# Patient Record
Sex: Female | Born: 1945 | Race: White | Hispanic: No | Smoking: Never smoker
Health system: Southern US, Community
[De-identification: ages and names within clinical notes are randomized; demographics above are authoritative.]

## PROBLEM LIST (undated history)

## (undated) DIAGNOSIS — C801 Malignant (primary) neoplasm, unspecified: Secondary | ICD-10-CM

## (undated) DIAGNOSIS — E119 Type 2 diabetes mellitus without complications: Secondary | ICD-10-CM

## (undated) DIAGNOSIS — J45909 Unspecified asthma, uncomplicated: Secondary | ICD-10-CM

## (undated) HISTORY — DX: Unspecified asthma, uncomplicated: J45.909

## (undated) HISTORY — PX: GASTRIC RESTRICTION SURGERY: SHX653

## (undated) HISTORY — PX: OTHER SURGICAL HISTORY: SHX169

## (undated) HISTORY — DX: Malignant (primary) neoplasm, unspecified: C80.1

## (undated) HISTORY — DX: Type 2 diabetes mellitus without complications: E11.9

## (undated) HISTORY — PX: COLON SURGERY: SHX602

---

## 1998-10-25 HISTORY — PX: JOINT REPLACEMENT: SHX530

## 1998-10-25 HISTORY — PX: REPLACEMENT TOTAL KNEE: SUR1224

## 1999-04-30 ENCOUNTER — Ambulatory Visit (HOSPITAL_BASED_OUTPATIENT_CLINIC_OR_DEPARTMENT_OTHER): Admission: RE | Admit: 1999-04-30 | Discharge: 1999-04-30 | Payer: Self-pay | Admitting: *Deleted

## 1999-07-02 ENCOUNTER — Encounter: Payer: Self-pay | Admitting: *Deleted

## 1999-07-02 ENCOUNTER — Inpatient Hospital Stay (HOSPITAL_COMMUNITY): Admission: RE | Admit: 1999-07-02 | Discharge: 1999-07-07 | Payer: Self-pay | Admitting: *Deleted

## 1999-07-07 ENCOUNTER — Inpatient Hospital Stay (HOSPITAL_COMMUNITY)
Admission: RE | Admit: 1999-07-07 | Discharge: 1999-07-14 | Payer: Self-pay | Admitting: Physical Medicine & Rehabilitation

## 2000-02-08 ENCOUNTER — Encounter: Payer: Self-pay | Admitting: *Deleted

## 2000-02-11 ENCOUNTER — Inpatient Hospital Stay (HOSPITAL_COMMUNITY): Admission: RE | Admit: 2000-02-11 | Discharge: 2000-02-14 | Payer: Self-pay | Admitting: *Deleted

## 2005-01-30 ENCOUNTER — Encounter: Admission: RE | Admit: 2005-01-30 | Discharge: 2005-01-30 | Payer: Self-pay | Admitting: Orthopedic Surgery

## 2005-02-17 ENCOUNTER — Ambulatory Visit (HOSPITAL_COMMUNITY): Admission: RE | Admit: 2005-02-17 | Discharge: 2005-02-17 | Payer: Self-pay | Admitting: Orthopedic Surgery

## 2005-02-17 ENCOUNTER — Ambulatory Visit (HOSPITAL_BASED_OUTPATIENT_CLINIC_OR_DEPARTMENT_OTHER): Admission: RE | Admit: 2005-02-17 | Discharge: 2005-02-17 | Payer: Self-pay | Admitting: Orthopedic Surgery

## 2005-03-03 ENCOUNTER — Encounter: Admission: RE | Admit: 2005-03-03 | Discharge: 2005-06-01 | Payer: Self-pay | Admitting: Orthopedic Surgery

## 2005-11-30 ENCOUNTER — Encounter: Admission: RE | Admit: 2005-11-30 | Discharge: 2005-11-30 | Payer: Self-pay | Admitting: Orthopedic Surgery

## 2006-09-29 ENCOUNTER — Encounter: Admission: RE | Admit: 2006-09-29 | Discharge: 2006-09-29 | Payer: Self-pay | Admitting: Occupational Medicine

## 2007-12-24 ENCOUNTER — Emergency Department (HOSPITAL_COMMUNITY): Admission: EM | Admit: 2007-12-24 | Discharge: 2007-12-24 | Payer: Self-pay | Admitting: Emergency Medicine

## 2007-12-29 ENCOUNTER — Emergency Department (HOSPITAL_COMMUNITY): Admission: EM | Admit: 2007-12-29 | Discharge: 2007-12-29 | Payer: Self-pay | Admitting: Emergency Medicine

## 2010-10-25 HISTORY — PX: TUMOR REMOVAL: SHX12

## 2011-03-12 NOTE — Op Note (Signed)
[place]  Patient:    Monica Booker, Monica Booker                      MRN: [id number] Adm. Date:  [id number] Attending:  Maryanna Shape                           Operative Report  PREOPERATIVE DIAGNOSIS:  Subluxing patellar prosthesis, right total knee arthroplasty.  POSTOPERATIVE DIAGNOSIS:  Subluxing patellar prosthesis, right total knee arthroplasty.  OPERATIVE PROCEDURE:  Exploration, full lateral release, and minor vastus medialis myofascia advancement.  SURGEON:  Reynolds Bowl, M.D.  ASSISTANT:  Nadara Mustard, M.D.  ANESTHESIA:  General.  DESCRIPTION:  The patient was given general anesthetic and 1 g of Ancef was given IV.  A Foley catheter was put in place.  All bony prominences were well-padded.  Her perineum and proximal thigh were isolated with sterile U-drape.  The right lower extremity was then prepped with DuraPrep and draped in the usual fashion.  Today, because of her large size, we used sterile, proximal tourniquet.  The complete drape included two Ioban drapes.  This patient was about 6-1/2 months post right total knee arthroplasty, had progressed up to about 110 degrees of flexion.  Sometime in late November or early December, she states, in retrospect, that she began having something bother her in the knee, and when I recently saw her she was complaining about recurrent acute  episodes of pain and we found that her patella was subluxing.  For this procedure then, we were prepared for a wide open exploration and revised patellar button to vastus medialis obliquus advancement, etc, as required.  For the above reasons, after the knee was exsanguinated and the proximal tourniquet elevated 400 mmHg, I made a straight midline incision through the old scar, carried it down to the prepatellar bursal level, and dissected soft tissues from the anterior patella.  I then found I could reduce the patella and flex  and extend and it felt like maybe it was a little bit tight laterally.  Therefore, it was certain we were going to do a wide and lateral release.  Therefore, made the approach through the lateral retinaculum and did a wide, lateral release. Having done that, I could see the patellar button, which appeared pristine.  There was a fringe of synovium around its edges.  The femoral condyle appeared pristine and  what I could see of the anterolateral polyethylene insert appeared normal. Then, let the patellar sit in its groove and with repetitive flexion-extension there as no suggestion of being too tight or tilting.  I felt this possibility was a little bit loose medially, and then, in order to tether medially while she was rehabilitating, I dissected some of the distal end of the vastus medialis obliquus and imbricated its myofascia and some of the medial soft tissues in order to snug that up very slightly.  Again with repetitive flexion-extension, the patella was tracking well.  At that point, I let the tourniquet down. I found one bleeder, which was probably a superolateral genicular vessel and that was Bovie cauterized.  Otherwise, there  were punctate areas of venous bleeding which were cauterized.  The entire area,  including the inside of the knee, was copiously irrigated with pulsatile lavage. Following which then the knee was closed by approximating the deep subcutaneous fat with 1 Vicryl and the more superficial with 2 0 Vicryl  and holding the skin edges in anatomic reduction with metal staples.  Xeroform dressing was applied, bulky  dressing, then the knee immobilizer, and the patient returned to the recovery room in good condition.  ESTIMATED BLOOD LOSS:  Perhaps 100 cc.  None was replaced. DD:  02/11/00 TD:  02/11/00 Job: [id number]

## 2011-03-12 NOTE — Op Note (Signed)
NAME:  Monica Booker, Monica Booker             ACCOUNT NO.:  [id number]   MEDICAL RECORD NO.:  [id number]          PATIENT TYPE:  AMB   LOCATION:  DSC                          FACILITY:  [facility]   PHYSICIAN:  Loreta Ave, M.D. DATE OF BIRTH:  January 21, 1946   DATE OF PROCEDURE:  02/17/2005  DATE OF DISCHARGE:                                 OPERATIVE REPORT   PREOPERATIVE DIAGNOSIS:  Traumatic complete rotator cuff tear right  shoulder, with underlying impingement and distal clavicle osteolysis.   POSTOPERATIVE DIAGNOSIS:  Traumatic complete rotator cuff tear right  shoulder, with underlying impingement and distal clavicle osteolysis, with  also complex tearing of the anterior and posterior labrum.   PROCEDURE:  Right shoulder exam under anesthesia, arthroscopy, debridement  of the labrum and rotator cuff, acromioplasty, CA ligament release, excision  of the distal clavicle, mini open repair of rotator cuff tear with the  bioabsorbable anchors x 4, with the bridge suture technique.   SURGEON:  Loreta Ave, M.D.   ASSISTANT:  Genene Churn. Denton Meek.   ANESTHESIA:  General.   BLOOD LOSS:  Minimal.   SPECIMENS:  None.   CULTURES:  None.   COMPLICATIONS:  None.   DRESSINGS:  Soft compressive, with shoulder immobilizer.   PROCEDURE:  The patient was brought to the operating room, and after  adequate anesthesia had been obtained, right shoulder examined.  Full motion  and good stability.  Because of her size and obesity, positioning and  exposure were difficult but able to be accomplished.  After being placed in  a beach chair position on the shoulder positioner, she was prepped and  draped in the usual sterile fashion.  Three standard portals, anterior,  posterior,and lateral.  Shoulder entered with blunt obturator, distended,  and inspected.  Complete avulsion of the entire supraspinatus tendon,  retracted 2 cm, but acute and therefore still mobile and able to be brought  over to its  attachment.  Complex tearing of the anterior and posterior  labrum debrided.  Biceps tendon, biceps anchor, __________ structures,  articular cartilage all otherwise intact.  Cannula redirected subacromially.  The cuff was assessed and felt to be repairable.  Type 3 acromion.  Bursa  resected.  Acromioplasty to a type 1 acromion with shaver and high-speed  bur, releasing CA ligament with cautery.  Grade 4 chondral changes of the [facility]  joint, with spurring.  Lateral centimeter of clavicle and periarticular  spurs all removed.  Adequacy of decompression confirmed via from all  portals.  Deltoid splitting incision through the lateral portal, exposing  subacromial space.  Cuff mobilized.  Well captured with fiber wire sutures,  which were attached to two 5.5 mm bioabsorbable anchors, which were  countersunk into the humerus at the tuberosity.  Once that was accomplished,  the cuff was pulled over and firmly tied down, attaching the cuff to the  lateral attachment, held with the bioabsorbable anchors.  Sutures were left  intact on the top of the cuff and brought down to the lateral cortex of  humerus, where they were then fixed secondarily with two bioabsorbable  Nautilus  anchors into the lateral shaft of the humerus.  This gave a nice  firm watertight closure to the cuff, markedly increasing contact area for  later healing.  Nice smooth contour of repair.  Adequacy of decompression  confirmed digitally at time of cuff repair.  Wound irrigated.  Deltoid  closed with Vicryl.  Skin and subcutaneous tissue closed with Vicryl.  Portals closed with nylon.  Margins of the wound injected with Marcaine.  Sterile compressive dressing and shoulder immobilizer applied.  Anesthesia  reversed.  Brought to the recovery room.  Tolerated surgery well.  No  complications.      DFM/MEDQ  D:  02/18/2005  T:  02/18/2005  Job:  [id number]

## 2011-03-12 NOTE — H&P (Signed)
[place]  Patient:    Monica Booker, Monica Booker                      MRN: [id number] Attending:  Reynolds Bowl, M.D.                         History and Physical  CHIEF COMPLAINT: Monica Booker is a 65 year old lady who underwent right total knee arthroplasty on July 02, 1999.  She progressed well and was up to 112 degrees flexion by November 2000.  At that time she seemed to be doing very well and happy, but sometime after that she insidiously began to note right anterior knee pain nd off and on sharp pain, and recently a complaint of her knee giving way.  HISTORY OF PRESENT ILLNESS: She came to the office and was evaluated and found o have a laterally subluxing right patella.  We fully discussed surgery and the fact that she has multiple possible complications, and the patient has elected to proceed on with revision.  PAST MEDICAL HISTORY: See old chart.  She has a past history of asthma and also has a significant problem with headaches, being treated by Dr. Dorethea Clan.  ALLERGIES: No known drug allergies.  PRIMARY CARE PHYSICIAN:  Her medical doctor is Barbette Hair. Vaughan Basta., M.D.  MEDICATIONS:  1. Topimax 100 mg b.i.d.  2. Prozac 40 mg b.i.d.  3. Celebrex.  PHYSICAL EXAMINATION:  VITAL SIGNS: Height 5 feet 4 inches.  Weight 296 pounds.  Temperature 97.2 degrees, blood pressure 128/90, pulse 60, respirations 16.  HEENT:  Extraocular movements full.  She has bilateral lens implants.  There are two caries in her teeth.  Tympanic membrane appear normal.  NECK: No adenopathy palpable, no bruits.  CHEST: Sounds are clear.  Volume is fair.  HEART: Distant sounds, no murmurs heard.  ABDOMEN: Obese, no palpable mass, no tenderness, bowel sounds okay.  EXTREMITIES: In the right lower extremity she has good dorsalis pedis pulse and  good capillary refill.  The knee will fully extend but any flexion causes pain about the patella.  The  knee is stable.  Comparing rotation of her lower extremities with her sitting and lying supine they are equal.  On touch sensation and pin sensation she claims she does not circumferentially appreciate pinprick  from high thigh distally.  RADIOGRAPHS:  X-rays show that femoral and tibial component appear to be well positioned.  She has normal alignment but on the sundown view the patella is subluxed laterally.  ADMITTING DIAGNOSES:  1. 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  2. Obesity.  3. Asthma.  4. History of headaches.  PLAN: The plan will be to revise her extensor mechanism as required.  We would lan on replacing the plastic.  Probably her problem since it occurred well after her surgery could be secondary loosening of her vastus medialis in combination with  tightening laterally.  This will be treated as required.  It does not seem that she has rotational problems.  I fully discussed this with her and she would like to  proceed on with same. DD:  02/08/00 TD:  02/08/00 Job: [id number]

## 2011-03-12 NOTE — Discharge Summary (Signed)
[place]  Patient:    Monica Booker, Monica Booker                      MRN: [id number] Adm. Date:  [id number] Disc. Date: [id number] Attending:  Maryanna Shape                           Discharge Summary  ADMITTING DIAGNOSES: 1. 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 2. Asthma. 3. Obesity. 4. History of headaches.  OPERATIVE PROCEDURE:  Right knee, 02/11/00; minor imbrication of vastus medialis and wide lateral release, left knee.  DISCHARGE DIAGNOSES: 1. 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 2. Asthma. 3. Obesity. 4. History of headaches.  HISTORY:  See that dictated on admission.  On the day of admission, the patient underwent surgery, as detailed in the Operative Note.  She was on pre- and postoperative prophylactic Ancef.  She was  begun on prophylactic Coumadin the day of surgery.  She made nice progress and as discharged the third postoperative day.  DISCHARGE INSTRUCTIONS:  Continue with outpatient physical therapy, partial weight- bearing as tolerated and plan to see me in the office when approximately ten days postoperative.  Do frequent quad sets, ankle pumps and call me with any concerns.  DISCHARGE MEDICATIONS:  Tylox for pain.  LABORATORY DATA:  Hemoglobin 14.6.  EKG - normal sinus rhythm, left axis deviation and right bundle-branch block.  Urinalysis normal.  Electrolytes normal.  Urine culture:  No growth. DD:  03/03/00 TD:  03/03/00 Job: [id number]

## 2011-07-19 LAB — I-STAT 8, (EC8 V) (CONVERTED LAB)
Acid-Base Excess: 2
BUN: 8
Chloride: 102
HCT: 50 — ABNORMAL HIGH
Hemoglobin: 17 — ABNORMAL HIGH
Potassium: 4

## 2013-02-22 DIAGNOSIS — C801 Malignant (primary) neoplasm, unspecified: Secondary | ICD-10-CM

## 2013-02-22 HISTORY — DX: Malignant (primary) neoplasm, unspecified: C80.1

## 2015-02-18 ENCOUNTER — Encounter: Payer: Self-pay | Admitting: Physical Therapy

## 2015-02-18 ENCOUNTER — Ambulatory Visit: Payer: Medicare Other | Attending: Internal Medicine | Admitting: Physical Therapy

## 2015-02-18 DIAGNOSIS — E119 Type 2 diabetes mellitus without complications: Secondary | ICD-10-CM | POA: Insufficient documentation

## 2015-02-18 DIAGNOSIS — J45909 Unspecified asthma, uncomplicated: Secondary | ICD-10-CM | POA: Insufficient documentation

## 2015-02-18 DIAGNOSIS — N8184 Pelvic muscle wasting: Secondary | ICD-10-CM | POA: Insufficient documentation

## 2015-02-18 DIAGNOSIS — R159 Full incontinence of feces: Secondary | ICD-10-CM | POA: Insufficient documentation

## 2015-02-18 DIAGNOSIS — M6289 Other specified disorders of muscle: Secondary | ICD-10-CM

## 2015-02-18 DIAGNOSIS — R32 Unspecified urinary incontinence: Secondary | ICD-10-CM | POA: Diagnosis not present

## 2015-02-18 DIAGNOSIS — Z85038 Personal history of other malignant neoplasm of large intestine: Secondary | ICD-10-CM | POA: Diagnosis not present

## 2015-02-18 NOTE — Patient Instructions (Addendum)
Quick Contraction: Gravity Eliminated (Hook-Lying)   Lie with hips and knees bent. Quickly squeeze then fully relax pelvic floor. Perform _3__ sets of _1__. Rest for _1__ seconds between sets. Do _3__ times a day. Do on vaginal then do anus  Copyright  VHI. All rights reserved.  Slow Contraction: Gravity Eliminated (Hook-Lying)   Lie with hips and knees bent. Slowly squeeze vagina for _5__ seconds. Rest for _3__ seconds. Repeat 5___ times. Do 3___ times a day. Then squeeze anus 3 seconds 5 times 3 times per day. Make sure you are breathing.    Copyright  VHI. All rights reserved.   1 time per day air out the perineum while laying on your back for 5 minutes to reduce redness of the vagina Patient able to return demonstration correctly with above exercise

## 2015-02-18 NOTE — Therapy (Signed)
[facility] [address], [place], [place], [address] Phone: [phone]   Fax:  [phone]  Physical Therapy Evaluation  Patient Details  Name: Monica Booker MRN: [id number] Date of Birth: [birth year] Referring Provider:  Tammy Sours, MD  Encounter Date: 02/18/2015      PT End of Session - 02/18/15 1340    Visit Number 1   Number of Visits 10  Medicare   Date for PT Re-Evaluation 04/15/15   PT Start Time 1100   PT Stop Time 1145   PT Time Calculation (min) 45 min   Activity Tolerance Patient tolerated treatment well   Behavior During Therapy [facility] for tasks assessed/performed      Past Medical History  Diagnosis Date  . Cancer 02/2013    colon cancer  . Diabetes mellitus without complication   . Asthma     Past Surgical History  Procedure Laterality Date  . Colon surgery    . Joint replacement  2000    right knee    There were no vitals filed for this visit.  Visit Diagnosis:  Pelvic floor dysfunction - Plan: PT plan of care cert/re-cert      Subjective Assessment - 02/18/15 1106    Subjective Patient has a history of colon cancer.  Patient reports she is unable to control her bowel and her urine.  Patient feels her muscle are not strong enough. Patient has been throwing away underwear due to leakage. Patient reports the more she moves the more she leaks both urine and feces.   Patient Stated Goals improve continence   Currently in Pain? No/denies   Multiple Pain Sites No            [facility] PT Assessment - 02/18/15 0001    Assessment   Medical Diagnosis Incontinence   Onset Date 03/03/13   Prior Therapy None   Precautions   Precautions --  No ultrasound   Balance Screen   Has the patient fallen in the past 6 months No   Has the patient had a decrease in activity level because of a fear of falling?  No   Is the patient reluctant to leave their home because of a fear of falling?  No    Prior Function   Vocation Part time employment   Vocation Requirements standing, raise leg up to get in car   Observation/Other Assessments   Observations midline abdominal scar due  to stomach stapling   Skin Integrity slight skin breakdown on the lower abomen where skin laps over    Focus on Therapeutic Outcomes (FOTO)  59% limitation   AROM   Lumbar Flexion decreased by 50%   Lumbar Extension decreased by 50%   Lumbar - Right Side Bend decreased by 50%   Lumbar - Left Side Bend decreased by 50%   Strength   Overall Strength --  abdomen 1/5   Right/Left Hip --  bil. hip abd 3-/5, extension bil. hip extension 4-/5   Ambulation/Gait   Ambulation/Gait Yes   Ambulation/Gait Assistance 6: Modified independent (Device/Increase time)   Gait Pattern Right circumduction;Left circumduction;Lateral trunk lean to right;Lateral trunk lean to left;Wide base of support                 Pelvic Floor Special Questions - 02/18/15 0001    Urinary Leakage Yes   How often all day, when she lift up her leg,   Pad use day 4, night 2  Activities that cause leaking With strong urge;Coughing;Sneezing;Laughing;Lifting;Bending;Walking   Urinary urgency Yes   Urinary frequency 5-8 times per day, nighttime 4 times   Fecal incontinence Yes  all activities   Fluid intake 8 glasses of water   Caffeine beverages drinks tea   Falling out feeling (prolapse) No   External Perineal Exam Increased redness and moisture   Pelvic Floor Internal Exam Patient confirms identification and approved physical therapist to assess pelvic floor strength and muscle integrity   Exam Type Rectal  vaginal, strength is 3/5 hold 5 seconds   Sensation decreased with feeling of contraction    Strength weak squeeze, no lift   Strength # of reps 3  vaginal quick flick 3 times   Strength # of seconds 3                  PT Education - 02/18/15 1340    Education provided Yes   Education Details pelvic floor  contraction in [employer]) Educated Patient   Methods Explanation;Demonstration;Tactile cues;Verbal cues;Handout   Comprehension Returned demonstration;Verbalized understanding          PT Short Term Goals - 02/18/15 1347    PT SHORT TERM GOAL #1   Title udnerstand how to toilet correctly to increase abdominal pressure as she relaxes the pelvic floor   Time 4   Period Weeks   Status New   PT SHORT TERM GOAL #2   Title pelvic floor strength >/=3/5   Time 4   Period Weeks   Status New   PT SHORT TERM GOAL #3   Title understand how to take care of the perineum to prevent skin breakdown   Time 4   Period Weeks   Status New   PT SHORT TERM GOAL #4   Title understand what bladder irritants are and how they affect the bladder   Time 4   Period Weeks   Status New           PT Long Term Goals - 02/18/15 1348    PT LONG TERM GOAL #1   Title pelvic floor strength is 4/5   Time 8   Period Weeks   Status New   PT LONG TERM GOAL #2   Title ability to reduce pads </= 1-2 per day due to decreased incontinence   Time 8   Period Weeks   Status New   PT LONG TERM GOAL #3   Title lift leg to get into car without fecal leakage due to increased strength   Time 8   Period Weeks   Status New   PT LONG TERM GOAL #4   Title fecal leakage and urinary leakage decreased >/= 50%   Time 8   Period Weeks   Status New   PT LONG TERM GOAL #5   Title when have the urge able to get to the bathroom due to increased control of pelvic floor   Time 8   Period Weeks   Status New               Plan - 02/18/15 1341    Clinical Impression Statement Patient is a 69 year old female with urinary and fecal incontinence.  Patient will leak both with movement of legs, when she has the urge, when she is working, and while she is sleeping. Patient has to wear 4-5 pads per day and several at night. Patient has weak hips, pelvic floor , abdominal muscles.  Patient has difficulty  with  coordinating pelvic floor contraction, difficulty with relaxing pelvic floor while trying to have a bowel movement.  Patient has to throw many pairs of underware out due to soiling them.  Patient is having difficulty with work due to fecal leakage when she is lifting her leg up to get in and out of the car. Patient perineal area is very moist and red.  Patient has redness  with start of skin breakdown on the lower abdominal .    Pt will benefit from skilled therapeutic intervention in order to improve on the following deficits Decreased skin integrity;Impaired flexibility;Decreased mobility;Decreased activity tolerance;Decreased strength;Impaired perceived functional ability;Decreased coordination   Rehab Potential Good   PT Frequency 2x / week   PT Duration 8 weeks   PT Treatment/Interventions Biofeedback;Therapeutic activities;Manual techniques;Neuromuscular re-education;Functional mobility training;Patient/family education;Therapeutic exercise;Electrical Stimulation;ADLs/Self Care Home Management   PT Next Visit Plan lower abdominal strengthening, toilet training, pelvic floor EMG, hip strengthening   PT Home Exercise Plan toileting technique   Recommended Other Services None   Consulted and Agree with Plan of Care Patient          G-Codes - 03/03/2015 1352    Functional Assessment Tool Used FOTO score is 83% limitaiton with bowel and urinary incontinence   Functional Limitation Other PT primary   Other PT Primary Current Status (L3810) At least 80 percent but less than 100 percent impaired, limited or restricted   Other PT Primary Goal Status (F7510) At least 40 percent but less than 60 percent impaired, limited or restricted       Problem List There are no active problems to display for this patient.   GRAY,CHERYL,PT 03-03-15, 1:56 PM  [facility] [address], [place], [place], [address] Phone: [phone]   Fax:   [phone]

## 2015-02-27 ENCOUNTER — Ambulatory Visit: Payer: Medicare Other | Attending: Internal Medicine | Admitting: Physical Therapy

## 2015-02-27 ENCOUNTER — Encounter: Payer: Self-pay | Admitting: Physical Therapy

## 2015-02-27 DIAGNOSIS — J45909 Unspecified asthma, uncomplicated: Secondary | ICD-10-CM | POA: Insufficient documentation

## 2015-02-27 DIAGNOSIS — R32 Unspecified urinary incontinence: Secondary | ICD-10-CM | POA: Diagnosis not present

## 2015-02-27 DIAGNOSIS — E119 Type 2 diabetes mellitus without complications: Secondary | ICD-10-CM | POA: Diagnosis not present

## 2015-02-27 DIAGNOSIS — Z85038 Personal history of other malignant neoplasm of large intestine: Secondary | ICD-10-CM | POA: Diagnosis not present

## 2015-02-27 DIAGNOSIS — R159 Full incontinence of feces: Secondary | ICD-10-CM | POA: Insufficient documentation

## 2015-02-27 DIAGNOSIS — N8184 Pelvic muscle wasting: Secondary | ICD-10-CM | POA: Diagnosis present

## 2015-02-27 DIAGNOSIS — M6289 Other specified disorders of muscle: Secondary | ICD-10-CM

## 2015-02-27 NOTE — Patient Instructions (Addendum)
Adduction: Hip - Knees Together With Pelvic Floor (Hook-Lying)   Lie with hips and knees bent, towel roll between knees. Squeeze pelvic floor while pushing knees together. Hold for _5__ seconds. Rest for _5__ seconds. Repeat 10___ times. Do _2__ times a day.   Copyright  VHI. All rights reserved.  External Rotation: Hip - Knees Apart With Pelvic Floor (Hook-Lying)   Lie with hips and knees bent, band tied just above knees. Squeeze pelvic floor while pulling knees apart. Hold for _5__ seconds. Rest for __5_ seconds. Repeat _10__ times. Do _2__ times a day.   Copyright  VHI. All rights reserved.  Bridging With Pelvic Floor (Hook-Lying)   Lie with hips and knees bent. Tighten pelvic floor while lifting bottom. Hold for _1__ seconds. Relax. Repeat _2__ times. Do _2__ times a day. Advanced: Hands across chest. Hands behind head.    Copyright  VHI. All rights reserved.  Patient able to return demonstration correctly with above exercises

## 2015-02-27 NOTE — Therapy (Signed)
[facility] [address] [place], [place], [address] Phone: [phone]   Fax:  [phone]  Physical Therapy Treatment  Patient Details  Name: Monica Booker MRN: [id number] Date of Birth: Jan 20, 1946 Referring Provider:  Tammy Sours, MD  Encounter Date: 02/27/2015      PT End of Session - 02/27/15 1446    Visit Number 2   Number of Visits 10  Medicare   Date for PT Re-Evaluation 04/15/15   PT Start Time 1400   PT Stop Time 9977   PT Time Calculation (min) 45 min   Equipment Utilized During Treatment --  internal vaginal sensor   Activity Tolerance Patient tolerated treatment well   Behavior During Therapy [facility] for tasks assessed/performed      Past Medical History  Diagnosis Date  . Cancer 02/2013    colon cancer  . Diabetes mellitus without complication   . Asthma     Past Surgical History  Procedure Laterality Date  . Colon surgery    . Joint replacement  2000    right knee    There were no vitals filed for this visit.  Visit Diagnosis:  Pelvic floor dysfunction      Subjective Assessment - 02/27/15 1410    Subjective No changes due to just having the eval.    Patient Stated Goals improve continence   Currently in Pain? No/denies   Multiple Pain Sites No                      Pelvic Floor Special Questions - 02/27/15 0001    Biofeedback hookly with verbal cues on breathing   Biofeedback sensor type Vaginal   Biofeedback Activity 10 second hold;Obturator/adductor assist     Pelvic floor EMG assessment in hookly: Resting level prior to exercise: 9.14uv is hight ( want around 3 uv) 3 Quick flicks : 4.14 uv 10 second contraction: 23.72 uv with difficulty recruiting muscle and relaxing 20 second contraction 28.96 uv Relaxation after exercise is 6.43 uv.  Exercise helped muscles relax.                PT Education - 02/27/15 1447    Education provided Yes   Education Details pelvic floor contraction with hip adduction and ER with yellow, bridges   Person(s) Educated Patient   Methods Explanation;Demonstration;Tactile cues;Verbal cues;Handout   Comprehension Verbal cues required;Returned demonstration;Verbalized understanding          PT Short Term Goals - 02/27/15 1452    PT SHORT TERM GOAL #1   Title udnerstand how to toilet correctly to increase abdominal pressure as she relaxes the pelvic floor   Period Weeks   Status New   PT SHORT TERM GOAL #2   Title pelvic floor strength >/=3/5   Time 4   Period Weeks   Status New   PT SHORT TERM GOAL #3   Title understand how to take care of the perineum to prevent skin breakdown   Time 4   Period Weeks   Status New   PT SHORT TERM GOAL #4   Title understand what bladder irritants are and how they affect the bladder   Time 4   Period Weeks   Status New           PT Long Term Goals - 02/18/15 1348    PT LONG TERM GOAL #1   Title pelvic floor strength is 4/5   Time 8  Period Weeks   Status New   PT LONG TERM GOAL #2   Title ability to reduce pads </= 1-2 per day due to decreased incontinence   Time 8   Period Weeks   Status New   PT LONG TERM GOAL #3   Title lift leg to get into car without fecal leakage due to increased strength   Time 8   Period Weeks   Status New   PT LONG TERM GOAL #4   Title fecal leakage and urinary leakage decreased >/= 50%   Time 8   Period Weeks   Status New   PT LONG TERM GOAL #5   Title when have the urge able to get to the bathroom due to increased control of pelvic floor   Time [facility] - 02/27/15 1449    Clinical Impression Statement Patient has not met any goals due to her just starting therapy. Patient is able to contract the pelvic floor with using the internal vaginal sensor and assistance with hip adduction and ER with yellow band.Patient has a high resting tone but it will decrease  to 6uv after exercise.  Patient has difficulty with recruitment of the pelvic floor muscles and relaxation.  Patient contraction  does not look like a plateu but has incresaed jagged edges. Perineal are continues to be moist and has increased redness.     Pt will benefit from skilled therapeutic intervention in order to improve on the following deficits Decreased skin integrity;Impaired flexibility;Decreased mobility;Decreased activity tolerance;Decreased strength;Impaired perceived functional ability;Decreased coordination   Rehab Potential Good   PT Frequency 1x / week   PT Duration 8 weeks   PT Treatment/Interventions Biofeedback;Therapeutic activities;Manual techniques;Neuromuscular re-education;Functional mobility training;Patient/family education;Therapeutic exercise;Electrical Stimulation;ADLs/Self Care Home Management   PT Next Visit Plan pelvic floor EMG with assistance in hookly; education on care of the perineum; bladder irritants   PT Home Exercise Plan toileting technique   Consulted and Agree with Plan of Care Patient        Problem List There are no active problems to display for this patient.   Seanne Chirico,PT 02/27/2015, 2:58 PM  [facility] [address], [place], [place], [address] Phone: [phone]   Fax:  [phone]

## 2015-03-06 ENCOUNTER — Encounter: Payer: Medicare Other | Admitting: Physical Therapy

## 2015-03-12 ENCOUNTER — Ambulatory Visit: Payer: Medicare Other | Admitting: Physical Therapy

## 2015-03-12 DIAGNOSIS — N8184 Pelvic muscle wasting: Secondary | ICD-10-CM | POA: Diagnosis not present

## 2015-03-12 DIAGNOSIS — M6289 Other specified disorders of muscle: Secondary | ICD-10-CM

## 2015-03-12 NOTE — Therapy (Signed)
[facility] [address] [place], [place], [address] Phone: [phone]   Fax:  [phone]  Physical Therapy Treatment  Patient Details  Name: Monica Booker MRN: [id number] Date of Birth: [birth year] Referring Provider:  Tammy Sours, MD  Encounter Date: 03/12/2015      PT End of Session - 03/12/15 1120    Visit Number 3   Number of Visits 10  Medicare   Date for PT Re-Evaluation 04/15/15   PT Start Time 1100   PT Stop Time 1132   PT Time Calculation (min) 32 min   Activity Tolerance Other (comment)  limited by not feeling well and vertigo   Behavior During Therapy [facility] for tasks assessed/performed      Past Medical History  Diagnosis Date  . Cancer 02/2013    colon cancer  . Diabetes mellitus without complication   . Asthma     Past Surgical History  Procedure Laterality Date  . Colon surgery    . Joint replacement  2000    right knee    There were no vitals filed for this visit.  Visit Diagnosis:  Pelvic floor dysfunction      Subjective Assessment - 03/12/15 1100    Subjective I am not feeling well today.  My stomach does not feel good and vertigo is bad. I had diahrrea this morning. I have been doing my exercises. I have been drinking more water.  I have been drinking more soda.    Patient Stated Goals improve continence   Currently in Pain? No/denies   Multiple Pain Sites No                         [facility] Adult PT Treatment/Exercise - 03/12/15 0001    Lumbar Exercises: Supine   Bridge Limitations work where there is no pain   Other Supine Lumbar Exercises hookly with ball spueeze between knees pelvic floor contraction hold 5 sec 10  VC to not move hips and breathe   Other Supine Lumbar Exercises hookly with pelvic floor contraction with hip abduction using the yellow band 20 times.                 PT Education - 03/12/15 1125    Education provided Yes   Education Details review bladder irritants, how to take care of her perineum as per handout on file, toileting techique,    Person(s) Educated Patient   Methods Explanation;Demonstration;Tactile cues;Verbal cues;Handout   Comprehension Verbalized understanding;Returned demonstration          PT Short Term Goals - 03/12/15 1110    PT SHORT TERM GOAL #1   Title udnerstand how to toilet correctly to increase abdominal pressure as she relaxes the pelvic floor   Time 4   Period Weeks   Status Achieved   PT SHORT TERM GOAL #2   Title pelvic floor strength >/=3/5   Time 4   Period Weeks   Status New   PT SHORT TERM GOAL #3   Time 4   Period Weeks   Status Achieved   PT SHORT TERM GOAL #4   Title understand what bladder irritants are and how they affect the bladder   Time 4   Period Weeks   Status Achieved           PT Long Term Goals - 03/12/15 1108    PT LONG TERM GOAL #1   Title pelvic floor  strength is 4/5   Time 8   Period Weeks   Status New   PT LONG TERM GOAL #2   Title ability to reduce pads </= 1-2 per day due to decreased incontinence   Time 8   Period Weeks   Status New  decreased to 4 pads   PT LONG TERM GOAL #3   Title lift leg to get into car without fecal leakage due to increased strength   Time 8   Period Weeks   Status New  improved by 15%   PT LONG TERM GOAL #4   Title fecal leakage and urinary leakage decreased >/= 50%   Time 8   Period Weeks   Status New  15% improvement   PT LONG TERM GOAL #5   Title when have the urge able to get to the bathroom due to increased control of pelvic floor   Time 8   Period Weeks   Status New  15% better.                Plan - 03/12/15 1121    Clinical Impression Statement Patient has met STG's #1, #3, #4.  Patient reports her urinary and fecal leakage has reduced by 15%.  Patient has decreased pad use to 4 pads per day. Patient replaced sodas with tea and understands that it is a bladder irritant  and needs to change her drink.  Patient reports no bladderleakage at night.  Patient is independent with her current HEP.  Patient was not able to do the pelvic floor EMG due to not feeling well. During treatment patient had a strong urine smell.    Pt will benefit from skilled therapeutic intervention in order to improve on the following deficits Decreased skin integrity;Impaired flexibility;Decreased mobility;Decreased activity tolerance;Decreased strength;Impaired perceived functional ability;Decreased coordination   Rehab Potential Good   Clinical Impairments Affecting Rehab Potential None   PT Frequency 1x / week   PT Duration 8 weeks   PT Treatment/Interventions Biofeedback;Therapeutic activities;Manual techniques;Neuromuscular re-education;Functional mobility training;Patient/family education;Therapeutic exercise;Electrical Stimulation;ADLs/Self Care Home Management   PT Next Visit Plan pelvic floor EMG with hip movements in standing   PT Home Exercise Plan current HEP   Consulted and Agree with Plan of Care Patient        Problem List There are no active problems to display for this patient.   GRAY,CHERYL,PT 03/12/2015, 11:36 AM  [facility] [address] [place], [place], [address] Phone: [phone]   Fax:  [phone]

## 2015-03-12 NOTE — Patient Instructions (Addendum)
Toileting Techniques for Bowel Movements (Defecation) Using your belly (abdomen) and pelvic floor muscles to have a bowel movement is usually instinctive.  Sometimes people can have problems with these muscles and have to relearn proper defecation (emptying) techniques.  If you have weakness in your muscles, organs that are falling out, decreased sensation in your pelvis, or ignore your urge to go, you may find yourself straining to have a bowel movement.  You are straining if you are: . holding your breath or taking in a huge gulp of air and holding it  . keeping your lips and jaw tensed and closed tightly . turning red in the face because of excessive pushing or forcing . developing or worsening your  hemorrhoids . getting faint while pushing . not emptying completely and have to defecate many times a day  If you are straining, you are actually making it harder for yourself to have a bowel movement.  Many people find they are pulling up with the pelvic floor muscles and closing off instead of opening the anus. Due to lack pelvic floor relaxation and coordination the abdominal muscles, one has to work harder to push the feces out.  Many people have never been taught how to defecate efficiently and effectively.  Notice what happens to your body when you are having a bowel movement.  While you are sitting on the toilet pay attention to the following areas: . Jaw and mouth position . Angle of your hips   . Whether your feet touch the ground or not . Arm placement  . Spine position . Waist . Belly tension . Anus (opening of the anal canal)  An Evacuation/Defecation Plan   Here are the 4 basic points:  1. Lean forward enough for your elbows to rest on your knees 2. Support your feet on the floor or use a low stool if your feet don't touch the floor  3. Push out your belly as if you have swallowed a beach ball-you should feel a widening of your waist 4. Open and relax your pelvic floor muscles,  rather than tightening around the anus      The following conditions my require modifications to your toileting posture:  . If you have had surgery in the past that limits your back, hip, pelvic, knee or ankle flexibility . Constipation   Your healthcare practitioner may make the following additional suggestions and adjustments:  1) Sit on the toilet  a) Make sure your feet are supported. b) Notice your hip angle and spine position-most people find it effective to lean forward or raise their knees, which can help the muscles around the anus to relax  c) When you lean forward, place your forearms on your thighs for support  2) Relax suggestions a) Breath deeply in through your nose and out slowly through your mouth as if you are smelling the flowers and blowing out the candles. b) To become aware of how to relax your muscles, contracting and releasing muscles can be helpful.  Pull your pelvic floor muscles in tightly by using the image of holding back gas, or closing around the anus (visualize making a circle smaller) and lifting the anus up and in.  Then release the muscles and your anus should drop down and feel open. Repeat 5 times ending with the feeling of relaxation. c) Keep your pelvic floor muscles relaxed; let your belly bulge out. d) The digestive tract starts at the mouth and ends at the anal opening, so be   sure to relax both ends of the tube.  Place your tongue on the roof of your mouth with your teeth separated.  This helps relax your mouth and will help to relax the anus at the same time.  3) Empty (defecation) a) Keep your pelvic floor and sphincter relaxed, then bulge your anal muscles.  Make the anal opening wide.  b) Stick your belly out as if you have swallowed a beach ball. c) Make your belly wall hard using your belly muscles while continuing to breathe. Doing this makes it easier to open your anus. d) Breath out and give a grunt (or try using other sounds such as  ahhhh, shhhhh, ohhhh or grrrrrrr).  4) Finish a) As you finish your bowel movement, pull the pelvic floor muscles up and in.  This will leave your anus in the proper place rather than remaining pushed out and down. If you leave your anus pushed out and down, it will start to feel as though that is normal and give you incorrect signals about needing to have a bowel movement.    Lay on back with feet elevated above your heart for 15 minutes 3 times per day.

## 2015-03-27 ENCOUNTER — Ambulatory Visit: Payer: Medicare Other | Admitting: Physical Therapy

## 2015-04-03 ENCOUNTER — Encounter: Payer: Self-pay | Admitting: Physical Therapy

## 2015-04-03 ENCOUNTER — Ambulatory Visit: Payer: Medicare Other | Attending: Internal Medicine | Admitting: Physical Therapy

## 2015-04-03 DIAGNOSIS — N8184 Pelvic muscle wasting: Secondary | ICD-10-CM | POA: Diagnosis present

## 2015-04-03 DIAGNOSIS — M6289 Other specified disorders of muscle: Secondary | ICD-10-CM

## 2015-04-03 NOTE — Therapy (Signed)
[facility] [address], [place], [place], [address] Phone: [phone]   Fax:  [phone]  Physical Therapy Treatment  Patient Details  Name: Monica Booker MRN: [id number] Date of Birth: 1946/07/07 Referring Provider:  Tammy Sours, MD  Encounter Date: 04/03/2015      PT End of Session - 04/03/15 0911    Visit Number 4   Number of Visits 10  Medicare   Date for PT Re-Evaluation 06/10/15   PT Start Time 0845   PT Stop Time 0930   PT Time Calculation (min) 45 min   Equipment Utilized During Treatment --  internal vaginal sensor   Activity Tolerance Patient tolerated treatment well   Behavior During Therapy [facility] for tasks assessed/performed      Past Medical History  Diagnosis Date  . Cancer 02/2013    colon cancer  . Diabetes mellitus without complication   . Asthma     Past Surgical History  Procedure Laterality Date  . Colon surgery    . Joint replacement  2000    right knee    There were no vitals filed for this visit.  Visit Diagnosis:  Pelvic floor dysfunction - Plan: PT plan of care cert/re-cert      Subjective Assessment - 04/03/15 0850    Subjective I feel okay today. I have become worse.  I am using 6 pads now. I am not sure why I am worse.  I have been trying to do my exercises but it hurst my hip .  It is becoming expensive to come to therapy.    Patient Stated Goals improve continence   Currently in Pain? No/denies                      Pelvic Floor Special Questions - 04/03/15 0001    Biofeedback hookly with verbal cues on breathing   Biofeedback sensor type Vaginal   Biofeedback Activity 10 second hold;Obturator/adductor assist     Hookly Pelvic floor EMG: 10 second contraction with verbal cues to not hold her breath with contraction around 6 uv. Quick contraction in Hookly is 10 uv. Sitting 5 second contraction 12 uv. Quick contraction in sitting is 10 uv.  Pelvic floor contraction with sit to stand and stand to sit and getting out of bed.              PT Education - 04/03/15 0915    Education provided Yes   Education Details Pelvic floor contraction in hookly and sitting   Person(s) Educated Patient   Methods Explanation;Demonstration;Tactile cues;Verbal cues;Handout   Comprehension Returned demonstration;Verbalized understanding          PT Short Term Goals - 03/12/15 1110    PT SHORT TERM GOAL #1   Title udnerstand how to toilet correctly to increase abdominal pressure as she relaxes the pelvic floor   Time 4   Period Weeks   Status Achieved   PT SHORT TERM GOAL #2   Title pelvic floor strength >/=3/5   Time 4   Period Weeks   Status New   PT SHORT TERM GOAL #3   Time 4   Period Weeks   Status Achieved   PT SHORT TERM GOAL #4   Title understand what bladder irritants are and how they affect the bladder   Time 4   Period Weeks   Status Achieved           PT Long Term  Goals - 04/03/15 0851    PT LONG TERM GOAL #2   Title ability to reduce pads </= 1-2 per day due to decreased incontinence   Time 8   Period Weeks   Status On-going  6 pads   PT LONG TERM GOAL #3   Title lift leg to get into car without fecal leakage due to increased strength   Time 8   Period Weeks   Status On-going  still has fecal leakage   PT LONG TERM GOAL #4   Title fecal leakage and urinary leakage decreased >/= 50%   Time 8   Period Weeks   Status On-going  has become worse   PT LONG TERM GOAL #5   Title when have the urge able to get to the bathroom due to increased control of pelvic floor   Time 8   Period Weeks   Status On-going  not able to hold it back               Plan - 04/03/15 3762    Clinical Impression Statement Patient came to therapy discouraged due to the leakage becoming worse.  Patient felt encouraged after she was able to see her pelvic floor contraction with exercise and functional movements.  Patient is able to contract the  to 10 uv with 10 second contraction and quick flicks.  Patient needs verbal cues to not hold her breath.  Patient needs verbal cues so when she has the urge she is not to bear down and push the feces out. Patient is able to hold the sensor in during the treatment compared to before it would come out.  Patient is able to contract her pelvic floor with going from sitting to standing and getting out of bed. Patient has not met any goals at this time due to not being consistent with her HEP.  Patient program was adjusted so it would not cause pain in righ thip.  Patient  would benefit from physical therapy to strengthen her pelvic floor to decrease urinary and fecal leakage. Patient will come every 3 weeks due to finances.    Pt will benefit from skilled therapeutic intervention in order to improve on the following deficits Decreased skin integrity;Impaired flexibility;Decreased mobility;Decreased activity tolerance;Decreased strength;Impaired perceived functional ability;Decreased coordination   Rehab Potential Good   PT Frequency 1x / week  1 time ever 3 weeks due to finances   PT Duration 8 weeks   PT Treatment/Interventions Biofeedback;Therapeutic activities;Manual techniques;Neuromuscular re-education;Functional mobility training;Patient/family education;Therapeutic exercise;Electrical Stimulation;ADLs/Self Care Home Management   PT Next Visit Plan pelvic floor EMG  with functional movements   PT Home Exercise Plan current HEP   Consulted and Agree with Plan of Care Patient        Problem List There are no active problems to display for this patient.   GRAY,CHERYL,PT 04/03/2015, 9:33 AM  [facility] [address] [place], [place], [address] Phone: [phone]   Fax:  [phone]

## 2015-04-03 NOTE — Patient Instructions (Addendum)
Quick Contraction: Gravity Eliminated (Hook-Lying)   Lie with hips and knees bent. Quickly squeeze then fully relax pelvic floor. Perform _1__ sets of _5__. Rest for _1__ seconds between sets. Do _3__ times a day.   Copyright  VHI. All rights reserved.   Slow Contraction: Gravity Eliminated (Hook-Lying)   Lie with hips and knees bent. Slowly squeeze pelvic floor for _10__ seconds. Rest for _5__ seconds. Repeat _10__ times. Do _3__ times a day.   Copyright  VHI. All rights reserved.  Slow Contraction: Gravity Resisted (Sitting)   Sitting, slowly squeeze pelvic floor for _5__ seconds. Rest for _5__ seconds. Repeat _5__ times. Do __3_ times a day.  Copyright  VHI. All rights reserved.  Quick Contraction: Gravity Resisted (Sitting)   Sitting, quickly squeeze then fully relax pelvic floor. Perform __1_ sets of _5__. Rest for 1___ seconds between sets. Do _3__ times a day.  Copyright  VHI. All rights reserved.  [place] [address] [place], [place] [address] Phone # [phone] Fax [phone]

## 2015-04-10 ENCOUNTER — Encounter: Payer: Medicare Other | Admitting: Physical Therapy

## 2015-04-24 ENCOUNTER — Ambulatory Visit: Payer: Medicare Other | Admitting: Physical Therapy

## 2015-05-08 ENCOUNTER — Ambulatory Visit: Payer: Medicare Other | Admitting: Physical Therapy

## 2015-05-22 ENCOUNTER — Ambulatory Visit: Payer: Medicare Other | Attending: Internal Medicine | Admitting: Physical Therapy

## 2015-05-22 ENCOUNTER — Encounter: Payer: Self-pay | Admitting: Physical Therapy

## 2015-05-22 VITALS — BP 138/95 | HR 97

## 2015-05-22 DIAGNOSIS — N8184 Pelvic muscle wasting: Secondary | ICD-10-CM | POA: Diagnosis present

## 2015-05-22 DIAGNOSIS — M6289 Other specified disorders of muscle: Secondary | ICD-10-CM

## 2015-05-22 NOTE — Therapy (Addendum)
[facility] [address], [place], [place], [address] Phone: [phone]   Fax:  [phone]  Physical Therapy Treatment  Patient Details  Name: Monica Booker MRN: [id number] Date of Birth: 1946/09/22 Referring Provider:  Tammy Sours, MD  Encounter Date: 05/22/2015      PT End of Session - 05/22/15 1435    Visit Number 5   Number of Visits 10  Medicare   Date for PT Re-Evaluation 06/10/15   PT Start Time 1400   PT Stop Time 1430   PT Time Calculation (min) 30 min   Activity Tolerance Patient tolerated treatment well   Behavior During Therapy [facility] for tasks assessed/performed      Past Medical History  Diagnosis Date  . Cancer 02/2013    colon cancer  . Diabetes mellitus without complication   . Asthma     Past Surgical History  Procedure Laterality Date  . Colon surgery    . Joint replacement  2000    right knee    Filed Vitals:   05/22/15 1413  BP: 138/95  Pulse: 97    Visit Diagnosis:  Pelvic floor dysfunction      Subjective Assessment - 05/22/15 1404    Subjective I was doing well but now I am back to wearing more pads.  I do not know if i will leak bowels.  I am back to the way it was. I am afraid to leave the home due to the incontinence.  Hard to go to work. I do exercises every other day do them  making me tired.    Patient Stated Goals improve continence   Currently in Pain? No/denies   Multiple Pain Sites No        G-code on 05/22/2015: FOTO score is 83% due to patient not having any progress.  Code is other primary.  Goal status is CK and Discharge status is CM.  Earlie Counts, PT 06/18/2015 2:20 PM                           PT Education - 05/22/15 1434    Education Details instructed patient to make an appoinment with her primary care doctor and referring doctor.  Instructed patient to keep her window down if she needs to take a nap in the car on the  way home.    Person(s) Educated Patient   Methods Explanation;Verbal cues   Comprehension Verbalized understanding          PT Short Term Goals - 05/22/15 1441    PT SHORT TERM GOAL #1   Title udnerstand how to toilet correctly to increase abdominal pressure as she relaxes the pelvic floor   Time 4   Status Achieved   PT SHORT TERM GOAL #2   Title pelvic floor strength >/=3/5   Time 4   Period Weeks   Status On-going   PT SHORT TERM GOAL #3   Title understand how to take care of the perineum to prevent skin breakdown   Time 4   Period Weeks   Status Achieved   PT SHORT TERM GOAL #4   Title understand what bladder irritants are and how they affect the bladder   Time 4   Period Weeks   Status Achieved           PT Long Term Goals - 05/22/15 1442    PT LONG TERM GOAL #1  Title pelvic floor strength is 4/5   Time 8   Period Weeks   Status On-going   PT LONG TERM GOAL #2   Title ability to reduce pads </= 1-2 per day due to decreased incontinence   Time 8   Period Weeks   Status On-going   PT LONG TERM GOAL #3   Title lift leg to get into car without fecal leakage due to increased strength   Time 8   Period Weeks   Status On-going   PT LONG TERM GOAL #4   Title fecal leakage and urinary leakage decreased >/= 50%   Time 8   Period Weeks   Status On-going   PT LONG TERM GOAL #5   Title when have the urge able to get to the bathroom due to increased control of pelvic floor   Period Weeks   Status On-going               Plan - 05/22/15 1435    Clinical Impression Statement Patient is a 69 year old female with urinary and fecal leakage.  Patient reports her incontinence has become worse and is wearing more pads. Patient reports she does her exercises every other day due to them fatiguing her. Patient is scared to leave her home due to fecal leakage.  Patient came to therapy looking very worn out and fatigued.  Pulse was 97 and blood pressure was 138/95.   Patient reported she was too tired to exercise and wanted to go home to sleep. Patient has not met any goals due to her having increased fecal and urinary leakage.  Patient was going home to call her referring doctor and  call her primary care doctor for an appointment.    Pt will benefit from skilled therapeutic intervention in order to improve on the following deficits Decreased skin integrity;Impaired flexibility;Decreased mobility;Decreased activity tolerance;Decreased strength;Impaired perceived functional ability;Decreased coordination   Rehab Potential Good   Clinical Impairments Affecting Rehab Potential None   PT Frequency 1x / week   PT Duration 8 weeks   PT Treatment/Interventions Biofeedback;Therapeutic activities;Manual techniques;Neuromuscular re-education;Functional mobility training;Patient/family education;Therapeutic exercise;Electrical Stimulation;ADLs/Self Care Home Management   PT Next Visit Plan patient on hold till she sees her doctor.  Find out what they said. When returns need renewal    PT Home Exercise Plan progress as needed.    Recommended Other Services None   Consulted and Agree with Plan of Care Patient        Problem List There are no active problems to display for this patient.   Jaedon Siler,PT 05/22/2015, 2:43 PM  [facility] [address], [place], [place], [address] Phone: [phone]   Fax:  [phone]   PHYSICAL THERAPY DISCHARGE SUMMARY  Visits from Start of Care: 5  Current functional level related to goals / functional outcomes: See above.   Remaining deficits: See above.   Education / Equipment: HEP  Plan:                                                    Patient goals were not met. Patient is being discharged due to not returning since the last visit.  Thank you for the referral. Earlie Counts, PT 06/18/2015 2:21 PM  ?????

## 2015-12-18 HISTORY — PX: BACK SURGERY: SHX140

## 2016-04-22 ENCOUNTER — Emergency Department (HOSPITAL_COMMUNITY): Payer: Medicare HMO

## 2016-04-22 ENCOUNTER — Observation Stay (HOSPITAL_COMMUNITY)
Admission: EM | Admit: 2016-04-22 | Discharge: 2016-04-25 | Disposition: A | Discharge status: Home / Self Care | Payer: Medicare HMO | Attending: Internal Medicine | Admitting: Internal Medicine

## 2016-04-22 ENCOUNTER — Encounter (HOSPITAL_COMMUNITY): Payer: Self-pay

## 2016-04-22 DIAGNOSIS — Z96651 Presence of right artificial knee joint: Secondary | ICD-10-CM | POA: Diagnosis not present

## 2016-04-22 DIAGNOSIS — R079 Chest pain, unspecified: Secondary | ICD-10-CM | POA: Diagnosis not present

## 2016-04-22 DIAGNOSIS — E1165 Type 2 diabetes mellitus with hyperglycemia: Secondary | ICD-10-CM

## 2016-04-22 DIAGNOSIS — E669 Obesity, unspecified: Secondary | ICD-10-CM | POA: Diagnosis present

## 2016-04-22 DIAGNOSIS — Z85038 Personal history of other malignant neoplasm of large intestine: Secondary | ICD-10-CM | POA: Insufficient documentation

## 2016-04-22 DIAGNOSIS — R7989 Other specified abnormal findings of blood chemistry: Secondary | ICD-10-CM | POA: Diagnosis not present

## 2016-04-22 DIAGNOSIS — R072 Precordial pain: Principal | ICD-10-CM | POA: Insufficient documentation

## 2016-04-22 DIAGNOSIS — R002 Palpitations: Secondary | ICD-10-CM | POA: Diagnosis present

## 2016-04-22 DIAGNOSIS — Z794 Long term (current) use of insulin: Secondary | ICD-10-CM | POA: Insufficient documentation

## 2016-04-22 DIAGNOSIS — J45909 Unspecified asthma, uncomplicated: Secondary | ICD-10-CM | POA: Diagnosis not present

## 2016-04-22 DIAGNOSIS — IMO0002 Reserved for concepts with insufficient information to code with codable children: Secondary | ICD-10-CM

## 2016-04-22 DIAGNOSIS — R778 Other specified abnormalities of plasma proteins: Secondary | ICD-10-CM | POA: Diagnosis not present

## 2016-04-22 DIAGNOSIS — E119 Type 2 diabetes mellitus without complications: Secondary | ICD-10-CM | POA: Diagnosis not present

## 2016-04-22 DIAGNOSIS — R55 Syncope and collapse: Secondary | ICD-10-CM | POA: Diagnosis present

## 2016-04-22 DIAGNOSIS — E785 Hyperlipidemia, unspecified: Secondary | ICD-10-CM | POA: Diagnosis present

## 2016-04-22 LAB — BASIC METABOLIC PANEL
ANION GAP: 10 (ref 5–15)
BUN: 13 mg/dL (ref 6–20)
CALCIUM: 8.3 mg/dL — AB (ref 8.9–10.3)
CO2: 23 mmol/L (ref 22–32)
Chloride: 99 mmol/L — ABNORMAL LOW (ref 101–111)
Creatinine, Ser: 1.19 mg/dL — ABNORMAL HIGH (ref 0.44–1.00)
GFR, EST AFRICAN AMERICAN: 52 mL/min — AB (ref >60)
GFR, EST NON AFRICAN AMERICAN: 45 mL/min — AB (ref >60)
Glucose, Bld: 365 mg/dL — ABNORMAL HIGH (ref 65–99)
Potassium: 3.8 mmol/L (ref 3.5–5.1)
Sodium: 132 mmol/L — ABNORMAL LOW (ref 135–145)

## 2016-04-22 LAB — CBC
HCT: 35.8 % — ABNORMAL LOW (ref 36.0–46.0)
Hemoglobin: 12.1 g/dL (ref 12.0–15.0)
MCH: 30.9 pg (ref 26.0–34.0)
MCHC: 33.8 g/dL (ref 30.0–36.0)
MCV: 91.3 fL (ref 78.0–100.0)
PLATELETS: 210 10*3/uL (ref 150–400)
RBC: 3.92 MIL/uL (ref 3.87–5.11)
RDW: 12.8 % (ref 11.5–15.5)
WBC: 14.1 10*3/uL — AB (ref 4.0–10.5)

## 2016-04-22 LAB — I-STAT TROPONIN, ED: TROPONIN I, POC: 0.04 ng/mL (ref 0.00–0.08)

## 2016-04-22 LAB — GLUCOSE, CAPILLARY: Glucose-Capillary: 347 mg/dL — ABNORMAL HIGH (ref 65–99)

## 2016-04-22 MED ORDER — ENOXAPARIN SODIUM 40 MG/0.4ML ~~LOC~~ SOLN
40.0000 mg | Freq: Every day | SUBCUTANEOUS | Status: DC
  Administered 2016-04-22 – 2016-04-24 (×3): 40 mg via SUBCUTANEOUS
  Filled 2016-04-22 (×3): qty 0.4

## 2016-04-22 MED ORDER — ASPIRIN 81 MG PO CHEW: 324.0000 mg | CHEWABLE_TABLET | Freq: Once | ORAL | Status: DC

## 2016-04-22 MED ORDER — ONDANSETRON HCL 4 MG/2ML IJ SOLN
4.0000 mg | Freq: Four times a day (QID) | INTRAMUSCULAR | Status: DC | PRN
  Administered 2016-04-24: 4 mg via INTRAVENOUS
  Filled 2016-04-22: qty 2

## 2016-04-22 MED ORDER — NITROGLYCERIN 0.4 MG SL SUBL: 0.4000 mg | SUBLINGUAL_TABLET | SUBLINGUAL | Status: DC | PRN

## 2016-04-22 MED ORDER — SODIUM CHLORIDE 0.9 % IV BOLUS (SEPSIS)
1000.0000 mL | Freq: Once | INTRAVENOUS | Status: AC
  Administered 2016-04-22: 1000 mL via INTRAVENOUS

## 2016-04-22 MED ORDER — BACLOFEN 10 MG PO TABS: 10.0000 mg | ORAL_TABLET | Freq: Three times a day (TID) | ORAL | Status: DC | PRN

## 2016-04-22 MED ORDER — INSULIN ASPART 100 UNIT/ML ~~LOC~~ SOLN: 0.0000 [IU] | SUBCUTANEOUS | Status: DC

## 2016-04-22 MED ORDER — DULOXETINE HCL 60 MG PO CPEP
60.0000 mg | ORAL_CAPSULE | Freq: Two times a day (BID) | ORAL | Status: DC
  Administered 2016-04-23 – 2016-04-25 (×5): 60 mg via ORAL
  Filled 2016-04-22 (×5): qty 1

## 2016-04-22 MED ORDER — INSULIN ASPART 100 UNIT/ML ~~LOC~~ SOLN
0.0000 [IU] | SUBCUTANEOUS | Status: DC
  Administered 2016-04-23: 9 [IU] via SUBCUTANEOUS
  Administered 2016-04-23: 5 [IU] via SUBCUTANEOUS
  Administered 2016-04-23: 3 [IU] via SUBCUTANEOUS
  Administered 2016-04-23: 5 [IU] via SUBCUTANEOUS
  Administered 2016-04-23 (×2): 7 [IU] via SUBCUTANEOUS
  Administered 2016-04-23: 5 [IU] via SUBCUTANEOUS
  Administered 2016-04-24: 3 [IU] via SUBCUTANEOUS
  Administered 2016-04-24: 5 [IU] via SUBCUTANEOUS
  Administered 2016-04-24 (×2): 3 [IU] via SUBCUTANEOUS
  Administered 2016-04-25: 2 [IU] via SUBCUTANEOUS
  Administered 2016-04-25: 1 [IU] via SUBCUTANEOUS

## 2016-04-22 MED ORDER — ACETAMINOPHEN 325 MG PO TABS
650.0000 mg | ORAL_TABLET | ORAL | Status: DC | PRN
  Administered 2016-04-23: 650 mg via ORAL
  Filled 2016-04-22: qty 2

## 2016-04-22 MED ORDER — ATORVASTATIN CALCIUM 40 MG PO TABS
40.0000 mg | ORAL_TABLET | Freq: Every evening | ORAL | Status: DC
  Administered 2016-04-23 – 2016-04-24 (×3): 40 mg via ORAL
  Filled 2016-04-22 (×3): qty 1

## 2016-04-22 NOTE — ED Notes (Signed)
Monica Booker Ward [phone]

## 2016-04-22 NOTE — ED Provider Notes (Signed)
CSN: [id number]     Arrival date & time    History   First MD Initiated Contact with Patient 04/22/16 1957     Chief Complaint  Patient presents with  . Chest Pain     (Consider location/radiation/quality/duration/timing/severity/associated sxs/prior Treatment) Patient is a 70 y.o. female presenting with chest pain. The history is provided by the patient.  Chest Pain Pain location:  Substernal area Pain quality: dull and pressure   Pain radiates to:  R arm Pain severity:  Moderate Onset quality:  Gradual Duration:  2 hours Progression:  Partially resolved Context comment:  Walking to [place] Relieved by:  Rest and nitroglycerin Worsened by:  Nothing tried Ineffective treatments:  None tried Associated symptoms: shortness of breath   Associated symptoms: no dizziness, no fever, no headache, no nausea, no palpitations and not vomiting   Risk factors: diabetes mellitus and hypertension   Risk factors: no coronary artery disease, no high cholesterol and no smoking    70 yo F With a chief complaint of chest pain and shortness of breath. This occurred when she was walking to use the porta potty. She became diaphoretic and passed out. She thinks she has had decreased oral intake in the past couple days she is just not felt well. Had radiation to her right arm. Felt like a pressure on her chest. Denies any prior cardiac history. Has a history of colon cancer though was completely removed on resection and has a clean bill of health through them. Denies any PE risk factors.  Past Medical History  Diagnosis Date  . Cancer ([place]) 02/2013    colon cancer  . Diabetes mellitus without complication ([place])   . Asthma    Past Surgical History  Procedure Laterality Date  . Colon surgery    . Joint replacement  2000    right knee   History reviewed. No pertinent family history. Social History  Substance Use Topics  . Smoking status: Never Smoker   . Smokeless tobacco: Never Used  .  Alcohol Use: No   OB History    No data available     Review of Systems  Constitutional: Negative for fever and chills.  HENT: Negative for congestion and rhinorrhea.   Eyes: Negative for redness and visual disturbance.  Respiratory: Positive for shortness of breath. Negative for wheezing.   Cardiovascular: Positive for chest pain. Negative for palpitations.  Gastrointestinal: Negative for nausea and vomiting.  Genitourinary: Negative for dysuria and urgency.  Musculoskeletal: Negative for myalgias and arthralgias.  Skin: Negative for pallor and wound.  Neurological: Negative for dizziness and headaches.      Allergies  Tramadol  Home Medications   Prior to Admission medications   Medication Sig Start Date End Date Taking? Authorizing Provider  atorvastatin (LIPITOR) 40 MG tablet Take 40 mg by mouth every evening.   Yes Historical Provider, MD  baclofen (LIORESAL) 10 MG tablet Take 10 mg by mouth 3 (three) times daily as needed for muscle spasms.   Yes Historical Provider, MD  DULoxetine (CYMBALTA) 60 MG capsule Take 60 mg by mouth 2 (two) times daily.   Yes Historical Provider, MD  glucose blood (ONETOUCH VERIO) test strip Test twice daily 08/20/15  Yes Historical Provider, MD  insulin NPH-regular Human (NOVOLIN 70/30 RELION) (70-30) 100 UNIT/ML injection Inject 30 Units into the skin 2 (two) times daily. 12/23/15  Yes Historical Provider, MD  meclizine (ANTIVERT) 25 MG tablet Take 25 mg by mouth 3 (three) times daily  as needed for dizziness.   Yes Historical Provider, MD  naproxen sodium (ALEVE) 220 MG tablet Take 220 mg by mouth 2 (two) times daily as needed (for pain or headaches).   Yes Historical Provider, MD   BP 89/71 mmHg  Pulse 80  Resp 22  SpO2 94% Physical Exam  Constitutional: She is oriented to person, place, and time. She appears well-developed and well-nourished. No distress.  HENT:  Head: Normocephalic and atraumatic.  Eyes: EOM are normal. Pupils are  equal, round, and reactive to light.  Neck: Normal range of motion. Neck supple.  Cardiovascular: Normal rate and regular rhythm.  Exam reveals no gallop and no friction rub.   No murmur heard. Pulmonary/Chest: Effort normal. She has no wheezes. She has no rales.  Abdominal: Soft. She exhibits no distension. There is no tenderness. There is no rebound and no guarding.  Musculoskeletal: She exhibits no edema or tenderness.  Neurological: She is alert and oriented to person, place, and time.  Skin: Skin is warm and dry. She is not diaphoretic.  Psychiatric: She has a normal mood and affect. Her behavior is normal.  Nursing note and vitals reviewed.   ED Course  Procedures (including critical care time) Labs Review Labs Reviewed  CBC - Abnormal; Notable for the following:    WBC 14.1 (*)    HCT 35.8 (*)    All other components within normal limits  BASIC METABOLIC PANEL - Abnormal; Notable for the following:    Sodium 132 (*)    Chloride 99 (*)    Glucose, Bld 365 (*)    Creatinine, Ser 1.19 (*)    Calcium 8.3 (*)    GFR calc non Af Amer 45 (*)    GFR calc Af Amer 52 (*)    All other components within normal limits  Randolm Idol, ED    Imaging Review Dg Chest 2 View  04/22/2016  CLINICAL DATA:  Chest pain. History of colorectal carcinoma. Near syncope. EXAM: CHEST  2 VIEW COMPARISON:  Chest radiograph December 24, 2007; PET-CT June 26, 2013 FINDINGS: There is slight scarring in the left base. There is no edema or consolidation. Heart size and pulmonary vascularity are normal. No adenopathy. There is atherosclerotic calcification in the aortic arch region. There is degenerative change in the thoracic spine and both shoulders. IMPRESSION: Slight scarring left base. No edema or consolidation. Aortic atherosclerosis. Electronically Signed   By: Lowella Grip III M.D.   On: 04/22/2016 20:45   I have personally reviewed and evaluated these images and lab results as part of my  medical decision-making.   EKG Interpretation   Date/Time:  Thursday April 22 2016 20:42:42 EDT Ventricular Rate:  90 PR Interval:    QRS Duration: 135 QT Interval:  411 QTC Calculation: 503 R Axis:   -66 Text Interpretation:  Sinus rhythm RBBB and LAFB Probable left ventricular  hypertrophy Baseline wander in lead(s) II III aVF No significant change  since last tracing Confirmed by Allister Lessley MD, Quillian Quince [id number]) on 04/22/2016  8:50:22 PM      MDM   Final diagnoses:  None    70 yo F with a chief complaint of chest pain. This happened on exertion was associated with shortness of breath. Patient also had a near-syncopal episode with this. This may be complicated by her decreased oral intake for the past couple days and her being out in the heat today. However her Heart score is 5.  Admit to obs.  The patients results and plan were reviewed and discussed.   Any x-rays performed were independently reviewed by myself.   Differential diagnosis were considered with the presenting HPI.  Medications  nitroGLYCERIN (NITROSTAT) SL tablet 0.4 mg (not administered)  sodium chloride 0.9 % bolus 1,000 mL (1,000 mLs Intravenous New Bag/Given 04/22/16 2156)    Filed Vitals:   04/22/16 2130 04/22/16 2145 04/22/16 2200 04/22/16 2215  BP: 89/71 75/54 81/35  96/50  Pulse: 80 76 71 67  Resp: 22 24 20 21   SpO2: 94% 94% 93% 94%    Final diagnoses:  Chest pain, unspecified    Admission/ observation were discussed with the admitting physician, patient and/or family and they are comfortable with the plan.      Deno Etienne, DO 04/22/16 2233

## 2016-04-22 NOTE — H&P (Signed)
History and Physical    Monica Booker [id number] DOB: 14-Jan-1946 DOA: 04/22/2016   PCP: Luetta Nutting, DO Chief Complaint:  Chief Complaint  Patient presents with  . Chest Pain    HPI: Monica Booker is a 70 y.o. female with medical history significant of DM, obesity, HLD.  Patient presents to the ED with c/o chest pain.  Symptoms onset 2 hours ago, have persisted until finally mostly resolving with rest and NTG.  Pain is Dull in quality, located in substernal area with radiation to R arm.  No N/V, dizziness, palpitations.  Does have SOB.  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  Review of Systems: As per HPI otherwise 10 point review of systems negative.    Past Medical History  Diagnosis Date  . Cancer ([place]) 02/2013    colon cancer  . Diabetes mellitus without complication ([place])   . Asthma     Past Surgical History  Procedure Laterality Date  . Colon surgery    . Joint replacement  2000    right knee     reports that she has never smoked. She has never used smokeless tobacco. She reports that she does not drink alcohol or use illicit drugs.  Allergies  Allergen Reactions  . Tramadol Nausea And Vomiting and Other (See Comments)    Also makes the patient feel dizzy and like she is "out of her mind"    History reviewed. No pertinent family history. No family history of early onset CAD.   Prior to Admission medications   Medication Sig Start Date End Date Taking? Authorizing Provider  atorvastatin (LIPITOR) 40 MG tablet Take 40 mg by mouth every evening.   Yes Historical Provider, MD  baclofen (LIORESAL) 10 MG tablet Take 10 mg by mouth 3 (three) times daily as needed for muscle spasms.   Yes Historical Provider, MD  DULoxetine (CYMBALTA) 60 MG capsule Take 60 mg by mouth 2 (two) times daily.   Yes Historical Provider, MD  glucose blood (ONETOUCH VERIO) test strip Test twice daily  08/20/15  Yes Historical Provider, MD  insulin NPH-regular Human (NOVOLIN 70/30 RELION) (70-30) 100 UNIT/ML injection Inject 30 Units into the skin 2 (two) times daily. 12/23/15  Yes Historical Provider, MD  meclizine (ANTIVERT) 25 MG tablet Take 25 mg by mouth 3 (three) times daily as needed for dizziness.   Yes Historical Provider, MD  naproxen sodium (ALEVE) 220 MG tablet Take 220 mg by mouth 2 (two) times daily as needed (for pain or headaches).   Yes Historical Provider, MD    Physical Exam: Filed Vitals:   04/22/16 2145 04/22/16 2200 04/22/16 2215 04/22/16 2230  BP: 75/54 81/35 96/50  95/51  Pulse: 76 71 67 71  Resp: 24 20 21 16   SpO2: 94% 93% 94% 95%      Constitutional: NAD, calm, comfortable Eyes: PERRL, lids and conjunctivae normal ENMT: Mucous membranes are moist. Posterior pharynx clear of any exudate or lesions.Normal dentition.  Neck: normal, supple, no masses, no thyromegaly Respiratory: clear to auscultation bilaterally, no wheezing, no crackles. Normal respiratory effort. No accessory muscle use.  Cardiovascular: Regular rate and rhythm, no murmurs / rubs / gallops. No extremity edema. 2+ pedal pulses. No carotid bruits.  Abdomen: no tenderness, no masses palpated. No hepatosplenomegaly. Bowel sounds positive.  Musculoskeletal: no clubbing / cyanosis. No joint deformity upper and lower extremities. Good ROM, no contractures. Normal muscle tone.  Skin: no rashes, lesions, ulcers. No induration Neurologic: CN 2-12 grossly intact. Sensation intact, DTR normal. Strength 5/5 in all 4.  Psychiatric: Normal judgment and insight. Alert and oriented x 3. Normal mood.    Labs on Admission: I have personally reviewed following labs and imaging studies  CBC:  Recent Labs Lab 04/22/16 2042  WBC 14.1*  HGB 12.1  HCT 35.8*  MCV 91.3  PLT [id number]   Basic Metabolic Panel:  Recent Labs Lab 04/22/16 2042  NA 132*  K 3.8  CL 99*  CO2 23  GLUCOSE 365*  BUN 13  CREATININE  1.19*  CALCIUM 8.3*   GFR: CrCl cannot be calculated (Unknown ideal weight.). Liver Function Tests: No results for input(s): AST, ALT, ALKPHOS, BILITOT, PROT, ALBUMIN in the last 168 hours. No results for input(s): LIPASE, AMYLASE in the last 168 hours. No results for input(s): AMMONIA in the last 168 hours. Coagulation Profile: No results for input(s): INR, PROTIME in the last 168 hours. Cardiac Enzymes: No results for input(s): CKTOTAL, CKMB, CKMBINDEX, TROPONINI in the last 168 hours. BNP (last 3 results) No results for input(s): PROBNP in the last 8760 hours. HbA1C: No results for input(s): HGBA1C in the last 72 hours. CBG: No results for input(s): GLUCAP in the last 168 hours. Lipid Profile: No results for input(s): CHOL, HDL, LDLCALC, TRIG, CHOLHDL, LDLDIRECT in the last 72 hours. Thyroid Function Tests: No results for input(s): TSH, T4TOTAL, FREET4, T3FREE, THYROIDAB in the last 72 hours. Anemia Panel: No results for input(s): VITAMINB12, FOLATE, FERRITIN, TIBC, IRON, RETICCTPCT in the last 72 hours. Urine analysis: No results found for: COLORURINE, APPEARANCEUR, LABSPEC, PHURINE, GLUCOSEU, HGBUR, BILIRUBINUR, KETONESUR, PROTEINUR, UROBILINOGEN, NITRITE, LEUKOCYTESUR Sepsis Labs: @LABRCNTIP (procalcitonin:4,lacticidven:4) )No results found for this or any previous visit (from the past 240 hour(s)).   Radiological Exams on Admission: Dg Chest 2 View  04/22/2016  CLINICAL DATA:  Chest pain. History of colorectal carcinoma. Near syncope. EXAM: CHEST  2 VIEW COMPARISON:  Chest radiograph December 24, 2007; PET-CT June 26, 2013 FINDINGS: There is slight scarring in the left base. There is no edema or consolidation. Heart size and pulmonary vascularity are normal. No adenopathy. There is atherosclerotic calcification in the aortic arch region. There is degenerative change in the thoracic spine and both shoulders. IMPRESSION: Slight scarring left base. No edema or consolidation.  Aortic atherosclerosis. Electronically Signed   By: Lowella Grip III M.D.   On: 04/22/2016 20:45    EKG: Independently reviewed.  Assessment/Plan Principal Problem:   Chest pain with moderate risk for cardiac etiology Active Problems:   DM2 (diabetes mellitus, type 2) (HCC)   Chest pain mod risk - Heart score of 4  CP obs pathway  Serial trops  Tele monitor  Npo after midnight  Have ordered stress test for AM  DM2 -  Hold home 70/30  Use SSI Q4H while NPO   DVT prophylaxis: Lovenox Code Status: Full Family Communication: No family in room Consults called: None Admission status: Admit to obs   GARDNER, [facility] Pager [phone] from 7PM-7AM  If 7AM-7PM, please contact the day physician for the patient [email or web address] Password [facility]  04/22/2016, 10:44 PM

## 2016-04-22 NOTE — ED Notes (Signed)
Pt transported to xray 

## 2016-04-22 NOTE — ED Notes (Signed)
Pt arrived via [facility] c/o mid chest pain, left arm numbness and a near syncopal episode.  [facility] gave 2 SL Nitro, 324mg  ASA, 1068mL NS.

## 2016-04-23 ENCOUNTER — Encounter (HOSPITAL_COMMUNITY): Payer: Self-pay | Admitting: Cardiology

## 2016-04-23 ENCOUNTER — Observation Stay (HOSPITAL_COMMUNITY): Payer: Medicare HMO

## 2016-04-23 DIAGNOSIS — R7989 Other specified abnormal findings of blood chemistry: Secondary | ICD-10-CM | POA: Diagnosis not present

## 2016-04-23 DIAGNOSIS — N179 Acute kidney failure, unspecified: Secondary | ICD-10-CM | POA: Diagnosis not present

## 2016-04-23 DIAGNOSIS — R55 Syncope and collapse: Secondary | ICD-10-CM

## 2016-04-23 DIAGNOSIS — R002 Palpitations: Secondary | ICD-10-CM | POA: Diagnosis not present

## 2016-04-23 DIAGNOSIS — N390 Urinary tract infection, site not specified: Secondary | ICD-10-CM | POA: Diagnosis not present

## 2016-04-23 DIAGNOSIS — E669 Obesity, unspecified: Secondary | ICD-10-CM | POA: Diagnosis not present

## 2016-04-23 DIAGNOSIS — E785 Hyperlipidemia, unspecified: Secondary | ICD-10-CM | POA: Diagnosis present

## 2016-04-23 DIAGNOSIS — E1165 Type 2 diabetes mellitus with hyperglycemia: Secondary | ICD-10-CM | POA: Diagnosis not present

## 2016-04-23 DIAGNOSIS — Z794 Long term (current) use of insulin: Secondary | ICD-10-CM

## 2016-04-23 DIAGNOSIS — R778 Other specified abnormalities of plasma proteins: Secondary | ICD-10-CM | POA: Diagnosis not present

## 2016-04-23 LAB — COMPREHENSIVE METABOLIC PANEL
ALBUMIN: 2.7 g/dL — AB (ref 3.5–5.0)
ALK PHOS: 73 U/L (ref 38–126)
ALT: 16 U/L (ref 14–54)
AST: 17 U/L (ref 15–41)
Anion gap: 8 (ref 5–15)
BUN: 14 mg/dL (ref 6–20)
CHLORIDE: 99 mmol/L — AB (ref 101–111)
CO2: 27 mmol/L (ref 22–32)
CREATININE: 0.91 mg/dL (ref 0.44–1.00)
Calcium: 8.5 mg/dL — ABNORMAL LOW (ref 8.9–10.3)
GFR calc non Af Amer: >60 mL/min (ref >60)
GLUCOSE: 289 mg/dL — AB (ref 65–99)
Potassium: 3.5 mmol/L (ref 3.5–5.1)
SODIUM: 134 mmol/L — AB (ref 135–145)
Total Bilirubin: 1.4 mg/dL — ABNORMAL HIGH (ref 0.3–1.2)
Total Protein: 6.7 g/dL (ref 6.5–8.1)

## 2016-04-23 LAB — CBC
HCT: 37.2 % (ref 36.0–46.0)
HEMOGLOBIN: 12.5 g/dL (ref 12.0–15.0)
MCH: 30.6 pg (ref 26.0–34.0)
MCHC: 33.6 g/dL (ref 30.0–36.0)
MCV: 91 fL (ref 78.0–100.0)
PLATELETS: 190 10*3/uL (ref 150–400)
RBC: 4.09 MIL/uL (ref 3.87–5.11)
RDW: 12.7 % (ref 11.5–15.5)
WBC: 11.9 10*3/uL — AB (ref 4.0–10.5)

## 2016-04-23 LAB — TROPONIN I
TROPONIN I: 0.06 ng/mL — AB (ref <0.03)
TROPONIN I: 0.03 ng/mL — AB (ref <0.03)
Troponin I: 0.04 ng/mL (ref <0.03)
Troponin I: 0.12 ng/mL (ref <0.03)
Troponin I: 0.04 ng/mL (ref <0.03)
Troponin I: 0.05 ng/mL (ref <0.03)

## 2016-04-23 LAB — URINALYSIS, ROUTINE W REFLEX MICROSCOPIC
BILIRUBIN URINE: NEGATIVE
Glucose, UA: 100 mg/dL — AB
KETONES UR: NEGATIVE mg/dL
NITRITE: POSITIVE — AB
PH: 5.5 (ref 5.0–8.0)
Protein, ur: 30 mg/dL — AB
SPECIFIC GRAVITY, URINE: 1.026 (ref 1.005–1.030)

## 2016-04-23 LAB — D-DIMER, QUANTITATIVE: D-Dimer, Quant: 1.33 ug/mL-FEU — ABNORMAL HIGH (ref 0.00–0.50)

## 2016-04-23 LAB — URINE MICROSCOPIC-ADD ON

## 2016-04-23 LAB — GLUCOSE, CAPILLARY
GLUCOSE-CAPILLARY: 323 mg/dL — AB (ref 65–99)
Glucose-Capillary: 242 mg/dL — ABNORMAL HIGH (ref 65–99)
Glucose-Capillary: 262 mg/dL — ABNORMAL HIGH (ref 65–99)
Glucose-Capillary: 277 mg/dL — ABNORMAL HIGH (ref 65–99)
Glucose-Capillary: 294 mg/dL — ABNORMAL HIGH (ref 65–99)
Glucose-Capillary: 311 mg/dL — ABNORMAL HIGH (ref 65–99)

## 2016-04-23 LAB — LIPID PANEL
CHOL/HDL RATIO: 4.7 ratio
Cholesterol: 131 mg/dL (ref 0–200)
HDL: 28 mg/dL — ABNORMAL LOW (ref >40)
LDL CALC: 70 mg/dL (ref 0–99)
Triglycerides: 163 mg/dL — ABNORMAL HIGH (ref <150)
VLDL: 33 mg/dL (ref 0–40)

## 2016-04-23 LAB — TSH: TSH: 1.664 u[IU]/mL (ref 0.350–4.500)

## 2016-04-23 IMAGING — CT CT ANGIO CHEST
2 of 6 series · 18 of 36 positions shown · IV contrast (Omni 300)
Comparison: PET-CT [DATE]; chest radiograph [DATE]

CLINICAL DATA: Shortness of breath. Recent syncope. History of
colorectal carcinoma

EXAM:
CT ANGIOGRAPHY CHEST WITH CONTRAST
TECHNIQUE: Multidetector CT imaging of the chest was performed using the
standard protocol during bolus administration of intravenous
contrast. Multiplanar CT image reconstructions and MIPs were
obtained to evaluate the vascular anatomy.
CONTRAST:  65 mL Isovue 370 nonionic

[Series 6: pe thins · axial · 0.72mm/px · z∈[-190,+43]mm · 17 of 367 slices shown]
[im 17/367  lung]
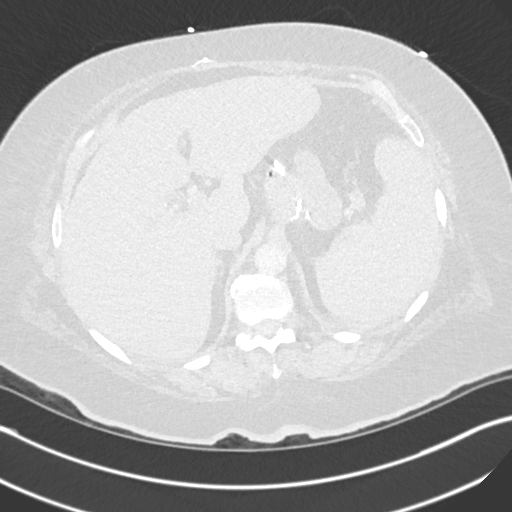
[im 34/367  mediastinal]
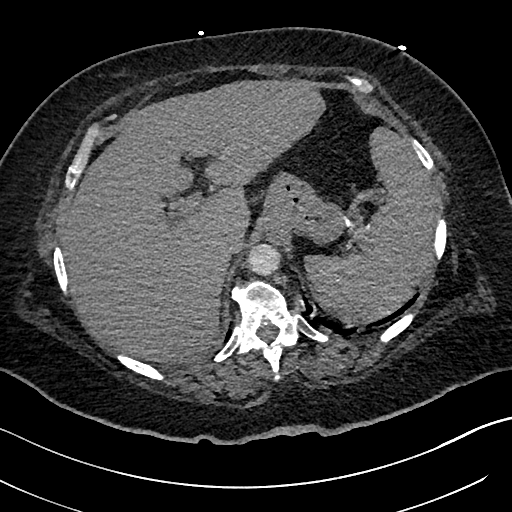
[im 67/367  lung]
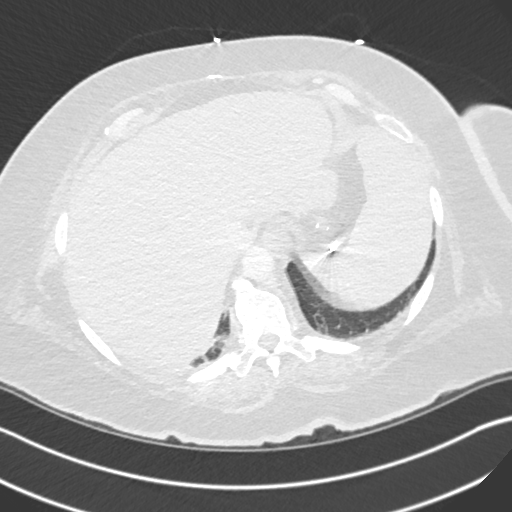
[im 84/367  mediastinal]
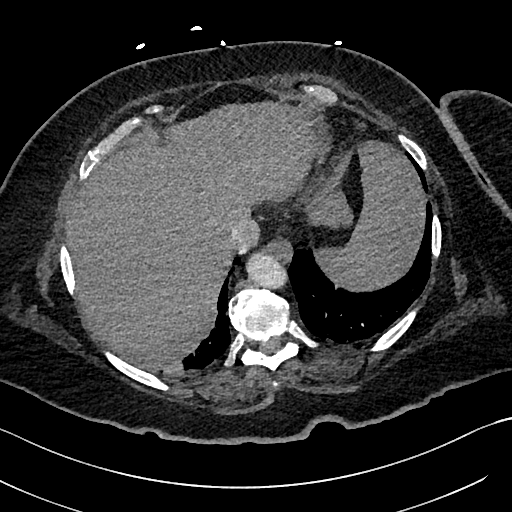
[im 100/367  lung]
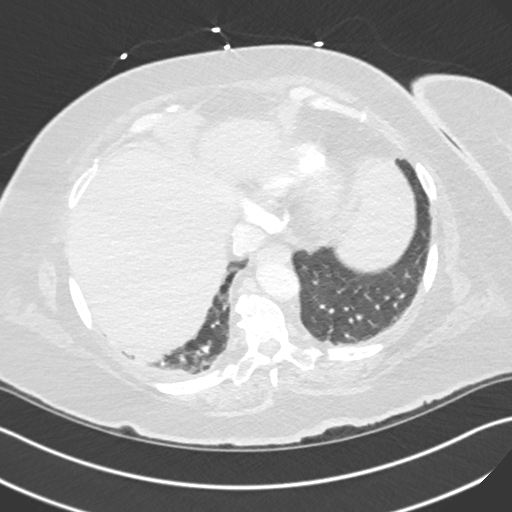
[im 117/367  mediastinal]
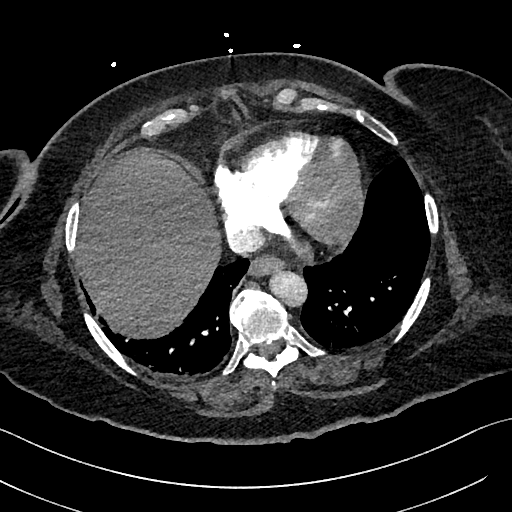
[im 150/367  lung]
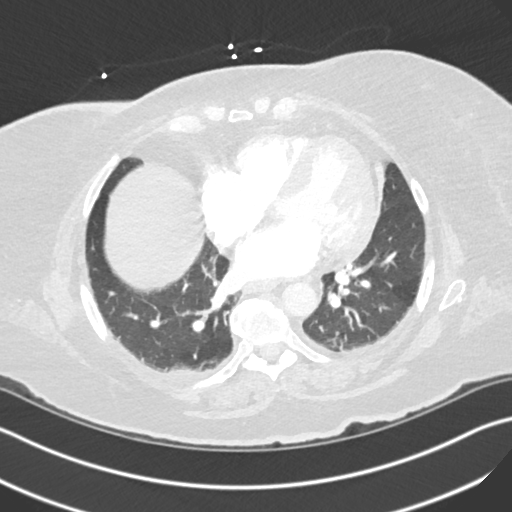
[im 167/367  mediastinal]
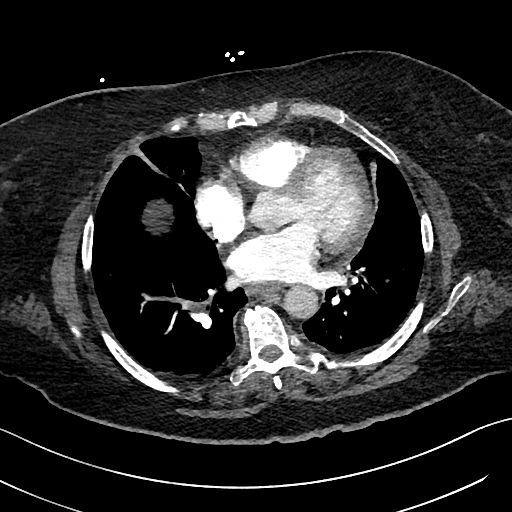
[im 184/367  lung]
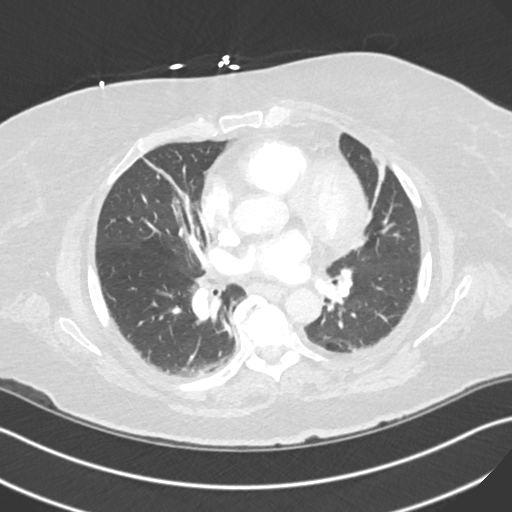
[im 200/367  mediastinal]
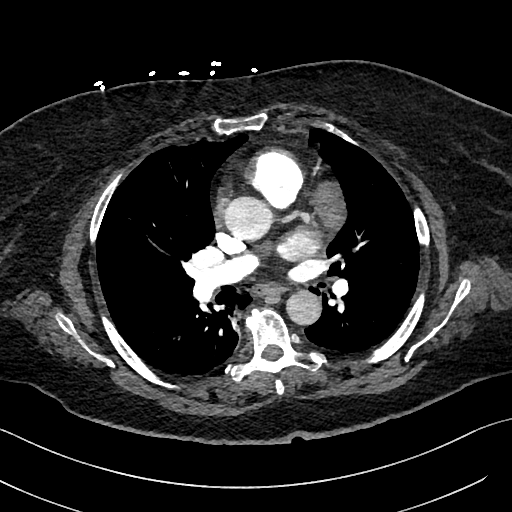
[im 217/367  lung]
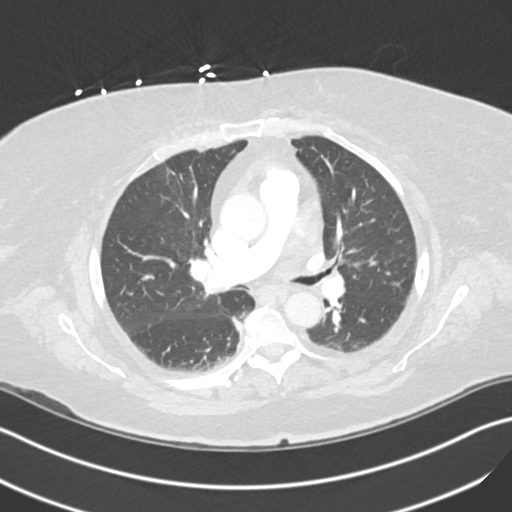
[im 250/367  mediastinal]
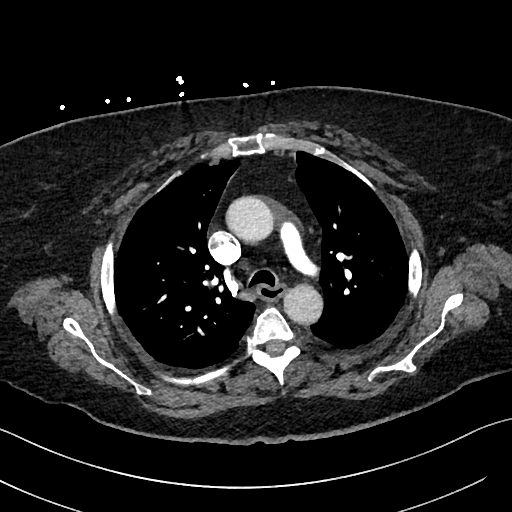
[im 267/367  lung]
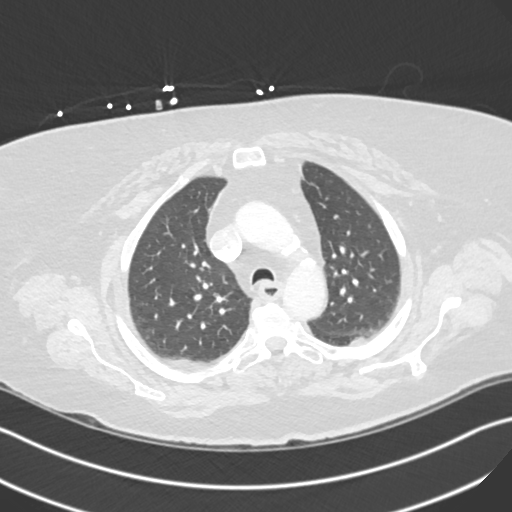
[im 283/367  mediastinal]
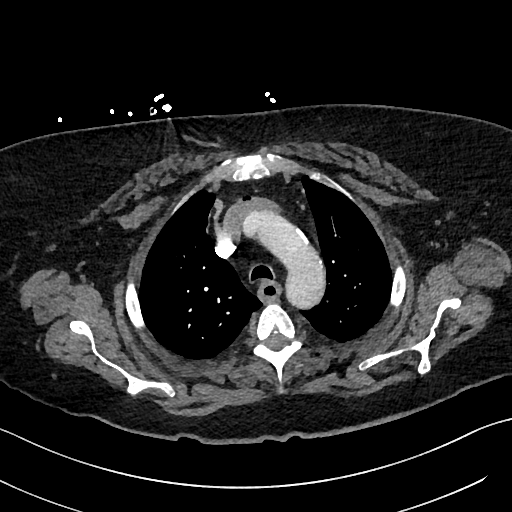
[im 300/367  lung]
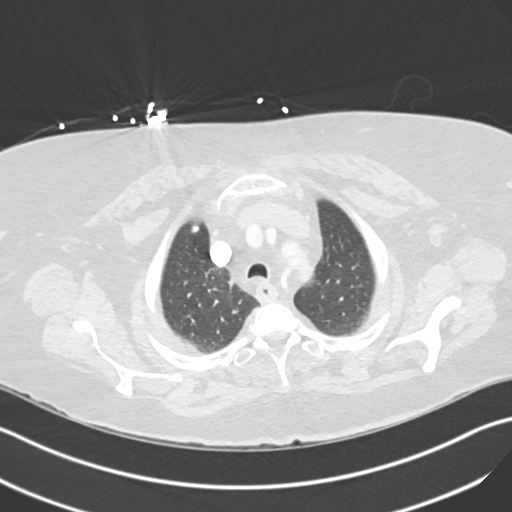
[im 333/367  mediastinal]
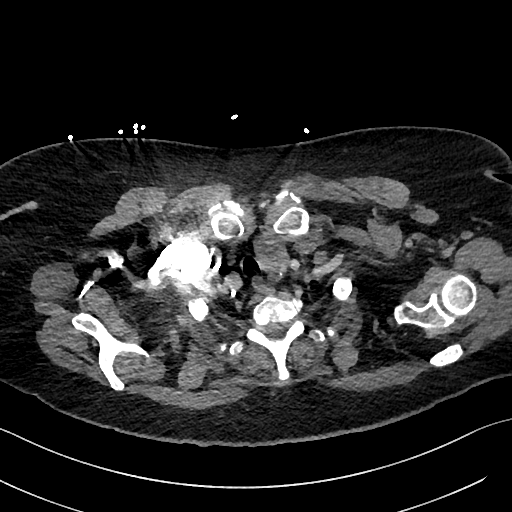
[im 350/367  lung]
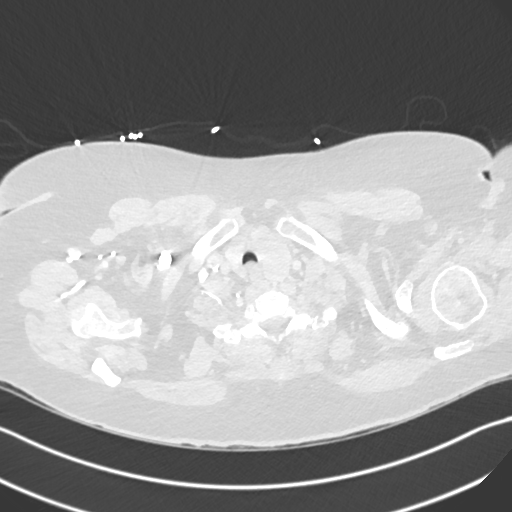

[Series 7: pe 2mm cor · coronal · 0.60mm/px · 1 of 101 slices shown]
[im 51/101  mediastinal]
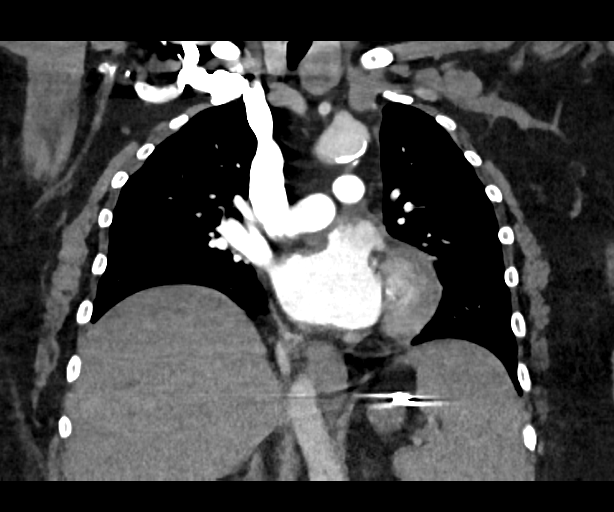

[18 of 36 positions shown; findings below may reference images not displayed]

FINDINGS: Cardiovascular: There is no demonstrable pulmonary embolus. There is
no thoracic aortic aneurysm or dissection. Visualized great vessels
appear unremarkable. There are scattered foci of atherosclerotic
calcification in the aorta. There are scattered foci of coronary
artery calcification. There is pericardium is not appreciably
thickened.

Mediastinum/Nodes: There is enlargement of the left lobe of the
thyroid with a dominant nodular lesion arising from the left lobe of
the thyroid measuring 3.0 x 2.0 cm, unchanged from [JT] study. There
is no appreciable thoracic adenopathy. There is thickening of the
wall of the mid to distal esophagus, a finding also present on the
prior study from [JT].

Lungs/Pleura: There is patchy atelectatic change in the lung bases
bilaterally. There is mild scarring in the anterior lung base
regions bilaterally, also present previously. There is no frank
edema or consolidation.

Upper Abdomen: In the visualized upper abdomen, there is evidence
prior gastric bypass surgery. Spleen is incompletely visualized but
appears prominent. Visualized upper abdominal structures otherwise
appear unremarkable.

Musculoskeletal: There is degenerative change in the thoracic spine.
There are no blastic or lytic bone lesions.

Review of the MIP images confirms the above findings.
IMPRESSION: No demonstrable pulmonary embolus.

Aortic atherosclerosis.

Stable enlargement of the left lobe of the thyroid with dominant
nodular lesion arising from the left lobe.

No adenopathy. Areas of parenchymal lung scarring and atelectasis,
without edema or consolidation.

Status post gastric bypass surgery. Spleen incompletely visualized
but appears rather prominent.

## 2016-04-23 MED ORDER — IOPAMIDOL (ISOVUE-370) INJECTION 76%
INTRAVENOUS | Status: AC
  Administered 2016-04-23: 100 mL via INTRAVENOUS
  Filled 2016-04-23: qty 100

## 2016-04-23 MED ORDER — DEXTROSE 5 % IV SOLN
1.0000 g | INTRAVENOUS | Status: DC
  Administered 2016-04-23 – 2016-04-24 (×2): 1 g via INTRAVENOUS
  Filled 2016-04-23 (×3): qty 10

## 2016-04-23 MED ORDER — ASPIRIN 81 MG PO CHEW
81.0000 mg | CHEWABLE_TABLET | Freq: Every day | ORAL | Status: DC
  Administered 2016-04-23 – 2016-04-25 (×3): 81 mg via ORAL
  Filled 2016-04-23 (×3): qty 1

## 2016-04-23 MED ORDER — TECHNETIUM TC 99M TETROFOSMIN IV KIT
10.0000 | PACK | Freq: Once | INTRAVENOUS | Status: AC | PRN
  Administered 2016-04-23: 10 via INTRAVENOUS

## 2016-04-23 MED ORDER — INSULIN GLARGINE 100 UNIT/ML ~~LOC~~ SOLN
20.0000 [IU] | Freq: Every day | SUBCUTANEOUS | Status: DC
  Administered 2016-04-23 – 2016-04-24 (×2): 20 [IU] via SUBCUTANEOUS
  Filled 2016-04-23 (×4): qty 0.2

## 2016-04-23 MED ORDER — METOPROLOL TARTRATE 12.5 MG HALF TABLET
12.5000 mg | ORAL_TABLET | Freq: Two times a day (BID) | ORAL | Status: DC
  Administered 2016-04-23 – 2016-04-25 (×4): 12.5 mg via ORAL
  Filled 2016-04-23 (×4): qty 1

## 2016-04-23 NOTE — Progress Notes (Signed)
npatient Diabetes Program Recommendations  AACE/ADA: New Consensus Statement on Inpatient Glycemic Control (2015)  Target Ranges:  Prepandial:   less than 140 mg/dL      Peak postprandial:   less than 180 mg/dL (1-2 hours)      Critically ill patients:  140 - 180 mg/dL   Results for PRERANA, JODOIN (MRN [id number]) as of 04/23/2016 13:01  Ref. Range 04/22/2016 23:15 04/23/2016 04:36 04/23/2016 07:36 04/23/2016 12:08  Glucose-Capillary Latest Ref Range: 65-99 mg/dL 347 (H) 323 (H) 311 (H) 262 (H)    Review of Glycemic Control  Diabetes history: DM 2 Outpatient Diabetes medications: 70/30 insulin 30 units bid Current orders for Inpatient glycemic control: Novolog correction 0-9 units q 4 hrs.  Inpatient Diabetes Program Recommendations:  Noted A1c pending results. Please consider Lantus 20 units daily (50% of home basal dose). Will follow.  Thank you, Nani Gasser. Vondell Sowell, RN, MSN, CDE Inpatient Glycemic Control Team Team Pager [phone] (8am-5pm) 04/23/2016 1:05 PM

## 2016-04-23 NOTE — Progress Notes (Signed)
Np on call notified of pt's first troponin being slightly positive at 0.06. Will continue to monitor the pt. Hoover Brunette, RN

## 2016-04-23 NOTE — Plan of Care (Signed)
Problem: Phase I Progression Outcomes Goal: Aspirin unless contraindicated Outcome: Completed/Met Date Met:  04/23/16 324 mg of ASA given by EMS prior to arrival Goal: MD aware of Cardiac Marker results Outcome: Progressing First troponin slightly positive at 0.06  Problem: Phase II Progression Outcomes Goal: Stress Test if indicated Outcome: Progressing Stress test ordered

## 2016-04-23 NOTE — Progress Notes (Addendum)
PROGRESS NOTE  Monica Booker [id number] DOB: [birth year] DOA: 04/22/2016 PCP: Luetta Nutting, DO  HPI/Recap of past 24 hours:   She is laying in bed, NAD  Assessment/Plan: Principal Problem:   Chest pain with moderate risk for cardiac etiology Active Problems:   DM2 (diabetes mellitus, type 2) (HCC)   Chest pain , pressure like with associated sob, risk factor including uncontrolled dm2,hdl on lipitor, obesity, mild troponin elevation which peaked at  0.12, now trending down. Cardiology consulted, stress test on 7/1, echo pending.  Elevated ddimer: ekg with RBBB, CTA negative for PE  Presyncope: likely from dehydration.   Hypotension/leukocytosis/mild elevated cr from dehydration vs uti, urine culture pending, empirically on rocephin.  Insulin dependent dm2, a1c pending, on insulin  Obesity with h/o gastric bypass surgery     Code Status: full  Family Communication: patient   Disposition Plan: remain in the hospital   Consultants:  cardiology  Procedures:  none  Antibiotics:  none   Objective: BP 147/62 mmHg  Pulse 92  Temp(Src) 97.8 F (36.6 C) (Oral)  Resp 17  Wt 107.684 kg (237 lb 6.4 oz)  SpO2 97% No intake or output data in the 24 hours ending 04/23/16 0815 Filed Weights   04/23/16 0354  Weight: 107.684 kg (237 lb 6.4 oz)    Exam:   General:  NAD, obese  Cardiovascular: RRR, does has chest wall pain on palpation  Respiratory: CTABL  Abdomen: Soft/ND/NT, positive BS  Musculoskeletal: No Edema  Neuro: aaox3  Data Reviewed: Basic Metabolic Panel:  Recent Labs Lab 04/22/16 2042  NA 132*  K 3.8  CL 99*  CO2 23  GLUCOSE 365*  BUN 13  CREATININE 1.19*  CALCIUM 8.3*   Liver Function Tests: No results for input(s): AST, ALT, ALKPHOS, BILITOT, PROT, ALBUMIN in the last 168 hours. No results for input(s): LIPASE, AMYLASE in the last 168 hours. No results for input(s): AMMONIA in the last 168 hours. CBC:  Recent  Labs Lab 04/22/16 2042  WBC 14.1*  HGB 12.1  HCT 35.8*  MCV 91.3  PLT 210   Cardiac Enzymes:    Recent Labs Lab 04/22/16 2329 04/23/16 0215 04/23/16 0355  TROPONINI 0.06* 0.04* 0.12*   BNP (last 3 results) No results for input(s): BNP in the last 8760 hours.  ProBNP (last 3 results) No results for input(s): PROBNP in the last 8760 hours.  CBG:  Recent Labs Lab 04/22/16 2315 04/23/16 0436 04/23/16 0736  GLUCAP 347* 323* 311*    No results found for this or any previous visit (from the past 240 hour(s)).   Studies: Dg Chest 2 View  04/22/2016  CLINICAL DATA:  Chest pain. History of colorectal carcinoma. Near syncope. EXAM: CHEST  2 VIEW COMPARISON:  Chest radiograph December 24, 2007; PET-CT June 26, 2013 FINDINGS: There is slight scarring in the left base. There is no edema or consolidation. Heart size and pulmonary vascularity are normal. No adenopathy. There is atherosclerotic calcification in the aortic arch region. There is degenerative change in the thoracic spine and both shoulders. IMPRESSION: Slight scarring left base. No edema or consolidation. Aortic atherosclerosis. Electronically Signed   By: Lowella Grip III M.D.   On: 04/22/2016 20:45    Scheduled Meds: . atorvastatin  40 mg Oral QPM  . DULoxetine  60 mg Oral BID  . enoxaparin (LOVENOX) injection  40 mg Subcutaneous QHS  . insulin aspart  0-9 Units Subcutaneous Q4H    Continuous Infusions:    Time spent:  69mins  Lun Muro MD, PhD  [facility] Pager [phone]. If 7PM-7AM, please contact night-coverage at [email or web address], password [facility] 04/23/2016, 8:15 AM

## 2016-04-23 NOTE — Consult Note (Signed)
Reason for Consult:   Elevated Troponin  Requesting Physician: Dr Erlinda Hong Primary Cardiologist New  HPI:   70 y/o obese female with a history of uncontrolled IDDM, HLD, and spinal stenosis with LE neuropathy. She is divorced, no children, lives in a mobile home in [place]. Her medical care has been provided at [facility]. She was in [place] at the [facility] when she went to the rest room to check her diaper (she has problems with incontinence). While there she was standing and had SOB and "fluttering" in her chest. She slid down the wall and collapsed on the floor which is where her friend found her. Her blood sugar was "400". The pt has never had an episode like this in the past. She only complained of "fluttering" in her chest to me. Her Telemetry shows NSR with PACs. She was scheduled to have a Lexiscan but her Troponin came back elevated and this was put on hold and we are formally asked to see her in consult. The pt denies any prior cardiac work up and I could find no evidence of prior cardiac testing in Care Everywhere.   PMHx:  Past Medical History  Diagnosis Date  . Cancer ([place]) 02/2013    colon cancer  . Diabetes mellitus without complication ([place])   . Asthma     Past Surgical History  Procedure Laterality Date  . Colon surgery    . Joint replacement  2000    right knee  . Tumor removal  2012     from head   . Replacement total knee  2000    rt side   . Gastric restriction surgery  1960s  . Rotator cuff surgery      left side  . Back surgery  12/18/15    SOCHx:  reports that she has never smoked. She has never used smokeless tobacco. She reports that she does not drink alcohol or use illicit drugs.Divorced, no children, lives in a mobile home in [place]  FAMHx: Father had an MI at 60  ALLERGIES: Allergies  Allergen Reactions  . Tramadol Nausea And Vomiting and Other (See Comments)    Also makes the patient feel dizzy and like she is "out  of her mind"    ROS: Review of Systems: General: negative for chills, fever, night sweats or weight changes.  Cardiovascular: negative for chest pain, dyspnea on exertion, edema, orthopnea, palpitations, paroxysmal nocturnal dyspnea or shortness of breath HEENT: negative for any visual disturbances, blindness, glaucoma Dermatological: negative for rash Respiratory: negative for cough, hemoptysis, or wheezing Urologic: negative for hematuria or dysuria urinary and fecal incontinence Abdominal: negative for nausea, vomiting, diarrhea, bright red blood per rectum, melena, or hematemesis Neurologic: negative for visual changes, syncope, or dizziness Chronic LE neuropathy, spinal stenosis Musculoskeletal: negative for back pain, joint pain, or swelling Psych: cooperative and appropriate All other systems reviewed and are otherwise negative except as noted above.   HOME MEDICATIONS: Prior to Admission medications   Medication Sig Start Date End Date Taking? Authorizing Provider  atorvastatin (LIPITOR) 40 MG tablet Take 40 mg by mouth every evening.   Yes Historical Provider, MD  baclofen (LIORESAL) 10 MG tablet Take 10 mg by mouth 3 (three) times daily as needed for muscle spasms.   Yes Historical Provider, MD  DULoxetine (CYMBALTA) 60 MG capsule Take 60 mg by mouth 2 (two) times daily.   Yes Historical Provider, MD  glucose blood (ONETOUCH VERIO)  test strip Test twice daily 08/20/15  Yes Historical Provider, MD  insulin NPH-regular Human (NOVOLIN 70/30 RELION) (70-30) 100 UNIT/ML injection Inject 30 Units into the skin 2 (two) times daily. 12/23/15  Yes Historical Provider, MD  meclizine (ANTIVERT) 25 MG tablet Take 25 mg by mouth 3 (three) times daily as needed for dizziness.   Yes Historical Provider, MD  naproxen sodium (ALEVE) 220 MG tablet Take 220 mg by mouth 2 (two) times daily as needed (for pain or headaches).   Yes Historical Provider, MD    HOSPITAL MEDICATIONS: I have reviewed  the patient's current medications.   Marland Kitchen aspirin  81 mg Oral Daily  . atorvastatin  40 mg Oral QPM  . DULoxetine  60 mg Oral BID  . enoxaparin (LOVENOX) injection  40 mg Subcutaneous QHS  . insulin aspart  0-9 Units Subcutaneous Q4H  . metoprolol tartrate  12.5 mg Oral BID       VITALS: Blood pressure 147/62, pulse 92, temperature 97.8 F (36.6 C), temperature source Oral, resp. rate 17, weight 237 lb 6.4 oz (107.684 kg), SpO2 97 %.  PHYSICAL EXAM: General appearance: alert, cooperative, no distress and morbidly obese Neck: no carotid bruit and no JVD Lungs: clear to auscultation bilaterally Heart: regular rate and rhythm Abdomen: obese, mid line surgical scar Extremities: extremities normal, atraumatic, no cyanosis or edema Pulses: 2+ and symmetric Skin: Skin color, texture, turgor normal. No rashes or lesions Neurologic: Grossly normal  LABS: Results for orders placed or performed during the hospital encounter of 04/22/16 (from the past 24 hour(s))  CBC     Status: Abnormal   Collection Time: 04/22/16  8:42 PM  Result Value Ref Range   WBC 14.1 (H) 4.0 - 10.5 K/uL   RBC 3.92 3.87 - 5.11 MIL/uL   Hemoglobin 12.1 12.0 - 15.0 g/dL   HCT 35.8 (L) 36.0 - 46.0 %   MCV 91.3 78.0 - 100.0 fL   MCH 30.9 26.0 - 34.0 pg   MCHC 33.8 30.0 - 36.0 g/dL   RDW 12.8 11.5 - 15.5 %   Platelets 210 150 - 400 K/uL  Basic metabolic panel     Status: Abnormal   Collection Time: 04/22/16  8:42 PM  Result Value Ref Range   Sodium 132 (L) 135 - 145 mmol/L   Potassium 3.8 3.5 - 5.1 mmol/L   Chloride 99 (L) 101 - 111 mmol/L   CO2 23 22 - 32 mmol/L   Glucose, Bld 365 (H) 65 - 99 mg/dL   BUN 13 6 - 20 mg/dL   Creatinine, Ser 1.19 (H) 0.44 - 1.00 mg/dL   Calcium 8.3 (L) 8.9 - 10.3 mg/dL   GFR calc non Af Amer 45 (L) >60 mL/min   GFR calc Af Amer 52 (L) >60 mL/min   Anion gap 10 5 - 15  I-stat troponin, ED (not at [facility])     Status: None   Collection Time: 04/22/16  9:05 PM  Result Value Ref  Range   Troponin i, poc 0.04 0.00 - 0.08 ng/mL   Comment 3          Glucose, capillary     Status: Abnormal   Collection Time: 04/22/16 11:15 PM  Result Value Ref Range   Glucose-Capillary 347 (H) 65 - 99 mg/dL  Troponin I-serum (0, 3, 6 hours)     Status: Abnormal   Collection Time: 04/22/16 11:29 PM  Result Value Ref Range   Troponin I 0.06 (HH) <0.03 ng/mL  Troponin I-serum (0, 3, 6 hours)     Status: Abnormal   Collection Time: 04/23/16  2:15 AM  Result Value Ref Range   Troponin I 0.04 (HH) <0.03 ng/mL  Troponin I-serum (0, 3, 6 hours)     Status: Abnormal   Collection Time: 04/23/16  3:55 AM  Result Value Ref Range   Troponin I 0.12 (HH) <0.03 ng/mL  Glucose, capillary     Status: Abnormal   Collection Time: 04/23/16  4:36 AM  Result Value Ref Range   Glucose-Capillary 323 (H) 65 - 99 mg/dL  Glucose, capillary     Status: Abnormal   Collection Time: 04/23/16  7:36 AM  Result Value Ref Range   Glucose-Capillary 311 (H) 65 - 99 mg/dL    EKG: NSR, RBBB, LAFB, poor anterior RW  IMAGING: Dg Chest 2 View  04/22/2016  CLINICAL DATA:  Chest pain. History of colorectal carcinoma. Near syncope. EXAM: CHEST  2 VIEW COMPARISON:  Chest radiograph December 24, 2007; PET-CT June 26, 2013 FINDINGS: There is slight scarring in the left base. There is no edema or consolidation. Heart size and pulmonary vascularity are normal. No adenopathy. There is atherosclerotic calcification in the aortic arch region. There is degenerative change in the thoracic spine and both shoulders. IMPRESSION: Slight scarring left base. No edema or consolidation. Aortic atherosclerosis. Electronically Signed   By: Lowella Grip III M.D.   On: 04/22/2016 20:45    IMPRESSION: Principal Problem:   Syncope and collapse Active Problems:   Troponin level elevated   Insulin dependent type 2 diabetes mellitus, uncontrolled (HCC)   Palpitations   Obesity (BMI 30-39.9)   Dyslipidemia   RECOMMENDATION: Check  f/u Troponin-if elevated consider diagnostic cath, if falling consider proceeding with Myoview. Check echo for LVF. MD to see. Add ASA and low dose beta blocker. Consider adding an ARB with DM.   Time Spent Directly with Patient: 50 minutes  Kerin Ransom, [place] beeper 04/23/2016, 9:32 AM    Patient seen and examined. Agree with assessment and plan.  Monica Booker is a 70 year old obese female who has a history of poorly controlled diabetes mellitus, hyperlipidemia, spinal stenosis with lower extremity neuropathy, and urinary incontinence.  She is not active.  She lives in a trailer home.  She denies any exertional precipitation of chest pain but admits to shortness of breath with activity.she yesterday while at a festival in [place].  She felt that she had become incontinent and went to the bathroom to check her diaper which she typically wears.  When taking off her jeans, she became lightheaded and fell to the floor.  She denies overt syncope.  She was unaware of antecedent palpitations.  She noted an episode of chest fullness which lasted less than 1 minute.  When she was on the floor.  She has  not had any chest pressure since.  She is unaware of any recurrent palpitations.  She was admitted to the hospitalist service last night and was scheduled to undergo a stress test this morning.  A troponin level came back mildly positive and increased to 0.12 this morning.  The patient was in the nuclear department and had been injected with her initial isotope.  Because the mild troponin elevation she was taken back to the room and the stress test was not done pending cardiology consultation.  Presently, the patient is pain-free.  She has been noted to have rare PACs.  Her d-dimer is mildly positive at 1.33.  Her  ECG does not reveal ischemic changes but shows sinus rhythm with right bundle branch block and left anterior hemiblock.  She has a 1 to 2/6 systolic murmur in the aortic area left  sternal border.  Doubt acute coronary syndrome in the etiology of her presyncope.  I would recommend pursuing a Lexiscan perfusion study, but since the patient was already injected with a dose this morning.  She cannot have this done today.  She will be undergoing a CT angioma for further evaluation of her positive d-dimer.  Recommend 2-D echo Doppler study to evaluate systolic and diastolic function and her cardiac murmur.  She will scheduled for a nuclear study tomorrow morning.  We will follow the patient with you.   Troy Sine, MD, [facility] 04/23/2016 10:09 AM

## 2016-04-24 ENCOUNTER — Observation Stay (HOSPITAL_COMMUNITY): Payer: Medicare HMO

## 2016-04-24 ENCOUNTER — Observation Stay (HOSPITAL_BASED_OUTPATIENT_CLINIC_OR_DEPARTMENT_OTHER): Payer: Medicare HMO

## 2016-04-24 DIAGNOSIS — N390 Urinary tract infection, site not specified: Secondary | ICD-10-CM | POA: Diagnosis not present

## 2016-04-24 DIAGNOSIS — E1165 Type 2 diabetes mellitus with hyperglycemia: Secondary | ICD-10-CM | POA: Diagnosis not present

## 2016-04-24 DIAGNOSIS — R079 Chest pain, unspecified: Secondary | ICD-10-CM | POA: Diagnosis not present

## 2016-04-24 DIAGNOSIS — R55 Syncope and collapse: Secondary | ICD-10-CM | POA: Diagnosis not present

## 2016-04-24 DIAGNOSIS — R7989 Other specified abnormal findings of blood chemistry: Secondary | ICD-10-CM | POA: Diagnosis not present

## 2016-04-24 LAB — NM MYOCAR MULTI W/SPECT W/WALL MOTION / EF
Estimated workload: 1 METS
Exercise duration (min): 0 min
Exercise duration (sec): 0 s
MPHR: 150 {beats}/min
Peak HR: 99 {beats}/min
Percent HR: 66 %
RPE: 0
Rest HR: 77 {beats}/min

## 2016-04-24 LAB — COMPREHENSIVE METABOLIC PANEL
ALK PHOS: 88 U/L (ref 38–126)
ALT: 15 U/L (ref 14–54)
AST: 19 U/L (ref 15–41)
Albumin: 2.6 g/dL — ABNORMAL LOW (ref 3.5–5.0)
Anion gap: 10 (ref 5–15)
BUN: 11 mg/dL (ref 6–20)
CHLORIDE: 98 mmol/L — AB (ref 101–111)
CO2: 26 mmol/L (ref 22–32)
CREATININE: 0.68 mg/dL (ref 0.44–1.00)
Calcium: 8.6 mg/dL — ABNORMAL LOW (ref 8.9–10.3)
GFR calc Af Amer: >60 mL/min (ref >60)
GFR calc non Af Amer: >60 mL/min (ref >60)
GLUCOSE: 191 mg/dL — AB (ref 65–99)
Potassium: 3.4 mmol/L — ABNORMAL LOW (ref 3.5–5.1)
SODIUM: 134 mmol/L — AB (ref 135–145)
Total Bilirubin: 1.1 mg/dL (ref 0.3–1.2)
Total Protein: 6.6 g/dL (ref 6.5–8.1)

## 2016-04-24 LAB — ECHOCARDIOGRAM COMPLETE
AO mean calculated velocity dopler: 142 cm/s
AV Mean grad: 9 mmHg
AV Peak grad: 16 mmHg
AV pk vel: 198 cm/s
FS: 37 % (ref 28–44)
IVS/LV PW RATIO, ED: 0.91
LA ID, A-P, ES: 35 mm
LA diam end sys: 35 mm
LA vol A4C: 47 ml
LA vol: 51.1 mL
LV PW d: 11.6 mm — AB (ref 0.6–1.1)
LVOT area: 2.27 cm2
LVOT diameter: 17 mm
TAPSE: 24 mm
VTI: 45.4 cm
Weight: 3779.2 oz

## 2016-04-24 LAB — GLUCOSE, CAPILLARY
GLUCOSE-CAPILLARY: 208 mg/dL — AB (ref 65–99)
GLUCOSE-CAPILLARY: 216 mg/dL — AB (ref 65–99)
GLUCOSE-CAPILLARY: 236 mg/dL — AB (ref 65–99)
GLUCOSE-CAPILLARY: 227 mg/dL — AB (ref 65–99)
Glucose-Capillary: 177 mg/dL — ABNORMAL HIGH (ref 65–99)
Glucose-Capillary: 264 mg/dL — ABNORMAL HIGH (ref 65–99)

## 2016-04-24 LAB — CBC
HCT: 38.6 % (ref 36.0–46.0)
HEMOGLOBIN: 13 g/dL (ref 12.0–15.0)
MCH: 30.4 pg (ref 26.0–34.0)
MCHC: 33.7 g/dL (ref 30.0–36.0)
MCV: 90.2 fL (ref 78.0–100.0)
PLATELETS: 200 10*3/uL (ref 150–400)
RBC: 4.28 MIL/uL (ref 3.87–5.11)
RDW: 12.7 % (ref 11.5–15.5)
WBC: 15.1 10*3/uL — AB (ref 4.0–10.5)

## 2016-04-24 LAB — MAGNESIUM: Magnesium: 1.6 mg/dL — ABNORMAL LOW (ref 1.7–2.4)

## 2016-04-24 MED ORDER — POTASSIUM CHLORIDE CRYS ER 20 MEQ PO TBCR
40.0000 meq | EXTENDED_RELEASE_TABLET | Freq: Once | ORAL | Status: AC
  Administered 2016-04-24: 40 meq via ORAL
  Filled 2016-04-24: qty 2

## 2016-04-24 MED ORDER — REGADENOSON 0.4 MG/5ML IV SOLN
0.4000 mg | Freq: Once | INTRAVENOUS | Status: AC
  Administered 2016-04-24: 0.4 mg via INTRAVENOUS
  Filled 2016-04-24: qty 5

## 2016-04-24 MED ORDER — TECHNETIUM TC 99M TETROFOSMIN IV KIT
10.0000 | PACK | Freq: Once | INTRAVENOUS | Status: AC | PRN
  Administered 2016-04-24: 10 via INTRAVENOUS

## 2016-04-24 MED ORDER — MAGNESIUM SULFATE 2 GM/50ML IV SOLN
2.0000 g | Freq: Once | INTRAVENOUS | Status: AC
  Administered 2016-04-24: 2 g via INTRAVENOUS
  Filled 2016-04-24: qty 50

## 2016-04-24 MED ORDER — REGADENOSON 0.4 MG/5ML IV SOLN
INTRAVENOUS | Status: AC
  Filled 2016-04-24: qty 5

## 2016-04-24 MED ORDER — TECHNETIUM TC 99M TETROFOSMIN IV KIT
30.0000 | PACK | Freq: Once | INTRAVENOUS | Status: AC | PRN
  Administered 2016-04-24: 30 via INTRAVENOUS

## 2016-04-24 NOTE — Progress Notes (Signed)
PROGRESS NOTE  Monica Booker [id number] DOB: [birth year] DOA: 04/22/2016 PCP: Luetta Nutting, DO  HPI/Recap of past 24 hours:   Returned from stress test, report feeling tired  Assessment/Plan: Principal Problem:   Syncope and collapse Active Problems:   Insulin dependent type 2 diabetes mellitus, uncontrolled (HCC)   Troponin level elevated   Obesity (BMI 30-39.9)   Dyslipidemia   Palpitations   Chest pain , pressure like with associated sob, risk factor including uncontrolled dm2,hdl on lipitor, obesity, mild troponin elevation which peaked at  0.12, now trending down. Cardiology consulted, stress test unremarkable, echo pending.  Elevated ddimer: ekg with RBBB, CTA negative for PE  Presyncope: likely from dehydration.   Hypotension/leukocytosis/mild elevated cr from dehydration vs uti, urine culture pending, empirically on rocephin.  Insulin dependent dm2, a1c pending, on insulin  Obesity with h/o gastric bypass surgery     Code Status: full  Family Communication: patient   Disposition Plan: remain in the hospital   Consultants:  cardiology  Procedures:  none  Antibiotics:  rocephin   Objective: BP 115/45 mmHg  Pulse 68  Temp(Src) 98.2 F (36.8 C) (Oral)  Resp 20  Wt 107.14 kg (236 lb 3.2 oz)  SpO2 96% No intake or output data in the 24 hours ending 04/24/16 0816 Filed Weights   04/23/16 0354 04/24/16 0332  Weight: 107.684 kg (237 lb 6.4 oz) 107.14 kg (236 lb 3.2 oz)    Exam:   General:  NAD, obese  Cardiovascular: RRR, does has chest wall pain on palpation  Respiratory: CTABL  Abdomen: Soft/ND/NT, positive BS  Musculoskeletal: No Edema  Neuro: aaox3  Data Reviewed: Basic Metabolic Panel:  Recent Labs Lab 04/22/16 2042 04/23/16 0902 04/24/16 0314  NA 132* 134* 134*  K 3.8 3.5 3.4*  CL 99* 99* 98*  CO2 23 27 26   GLUCOSE 365* 289* 191*  BUN 13 14 11   CREATININE 1.19* 0.91 0.68  CALCIUM 8.3* 8.5* 8.6*  MG  --    --  1.6*   Liver Function Tests:  Recent Labs Lab 04/23/16 0902 04/24/16 0314  AST 17 19  ALT 16 15  ALKPHOS 73 88  BILITOT 1.4* 1.1  PROT 6.7 6.6  ALBUMIN 2.7* 2.6*   No results for input(s): LIPASE, AMYLASE in the last 168 hours. No results for input(s): AMMONIA in the last 168 hours. CBC:  Recent Labs Lab 04/22/16 2042 04/23/16 0902 04/24/16 0314  WBC 14.1* 11.9* 15.1*  HGB 12.1 12.5 13.0  HCT 35.8* 37.2 38.6  MCV 91.3 91.0 90.2  PLT 210 190 200   Cardiac Enzymes:    Recent Labs Lab 04/23/16 0215 04/23/16 0355 04/23/16 0902 04/23/16 1347 04/23/16 1943  TROPONINI 0.04* 0.12* 0.04* 0.03* 0.05*   BNP (last 3 results) No results for input(s): BNP in the last 8760 hours.  ProBNP (last 3 results) No results for input(s): PROBNP in the last 8760 hours.  CBG:  Recent Labs Lab 04/23/16 1208 04/23/16 1608 04/23/16 1936 04/23/16 2331 04/24/16 0327  GLUCAP 262* 242* 294* 277* 208*    No results found for this or any previous visit (from the past 240 hour(s)).   Studies: Ct Angio Chest Pe W Or Wo Contrast  04/23/2016  CLINICAL DATA:  Shortness of breath. Recent syncope. History of colorectal carcinoma EXAM: CT ANGIOGRAPHY CHEST WITH CONTRAST TECHNIQUE: Multidetector CT imaging of the chest was performed using the standard protocol during bolus administration of intravenous contrast. Multiplanar CT image reconstructions and MIPs were obtained to evaluate  the vascular anatomy. CONTRAST:  65 mL Isovue 370 nonionic COMPARISON:  PET-CT June 26, 2013; chest radiograph April 22, 2016 FINDINGS: Cardiovascular: There is no demonstrable pulmonary embolus. There is no thoracic aortic aneurysm or dissection. Visualized great vessels appear unremarkable. There are scattered foci of atherosclerotic calcification in the aorta. There are scattered foci of coronary artery calcification. There is pericardium is not appreciably thickened. Mediastinum/Nodes: There is enlargement  of the left lobe of the thyroid with a dominant nodular lesion arising from the left lobe of the thyroid measuring 3.0 x 2.0 cm, unchanged from 2014 study. There is no appreciable thoracic adenopathy. There is thickening of the wall of the mid to distal esophagus, a finding also present on the prior study from 2014. Lungs/Pleura: There is patchy atelectatic change in the lung bases bilaterally. There is mild scarring in the anterior lung base regions bilaterally, also present previously. There is no frank edema or consolidation. Upper Abdomen: In the visualized upper abdomen, there is evidence prior gastric bypass surgery. Spleen is incompletely visualized but appears prominent. Visualized upper abdominal structures otherwise appear unremarkable. Musculoskeletal: There is degenerative change in the thoracic spine. There are no blastic or lytic bone lesions. Review of the MIP images confirms the above findings. IMPRESSION: No demonstrable pulmonary embolus. Aortic atherosclerosis. Stable enlargement of the left lobe of the thyroid with dominant nodular lesion arising from the left lobe. No adenopathy. Areas of parenchymal lung scarring and atelectasis, without edema or consolidation. Status post gastric bypass surgery. Spleen incompletely visualized but appears rather prominent. Electronically Signed   By: Lowella Grip III M.D.   On: 04/23/2016 12:07    Scheduled Meds: . aspirin  81 mg Oral Daily  . atorvastatin  40 mg Oral QPM  . cefTRIAXone (ROCEPHIN)  IV  1 g Intravenous Q24H  . DULoxetine  60 mg Oral BID  . enoxaparin (LOVENOX) injection  40 mg Subcutaneous QHS  . insulin aspart  0-9 Units Subcutaneous Q4H  . insulin glargine  20 Units Subcutaneous QHS  . magnesium sulfate 1 - 4 g bolus IVPB  2 g Intravenous Once  . metoprolol tartrate  12.5 mg Oral BID  . potassium chloride  40 mEq Oral Once    Continuous Infusions:    Time spent: 71mins  Taylor Spilde MD, PhD  [facility] Pager  [phone]. If 7PM-7AM, please contact night-coverage at [email or web address], password [facility] 04/24/2016, 8:16 AM

## 2016-04-24 NOTE — Progress Notes (Signed)
Subjective:  C/o weakness.  Not SOB, no chest pain  Objective:  Vital Signs in the last 24 hours: BP 158/82 mmHg  Pulse 87  Temp(Src) 98.2 F (36.8 C) (Oral)  Resp 20  Wt 107.14 kg (236 lb 3.2 oz)  SpO2 96%  Physical Exam: Obese WF NAD Lungs:  Clear Cardiac:  Regular rhythm, normal S1 and S2, no S3, 1/6 systolic murmur Extremities:  No edema present  Intake/Output from previous day:    Weight Filed Weights   04/23/16 0354 04/24/16 0332  Weight: 107.684 kg (237 lb 6.4 oz) 107.14 kg (236 lb 3.2 oz)    Lab Results: Basic Metabolic Panel:  Recent Labs  04/23/16 0902 04/24/16 0314  NA 134* 134*  K 3.5 3.4*  CL 99* 98*  CO2 27 26  GLUCOSE 289* 191*  BUN 14 11  CREATININE 0.91 0.68   CBC:  Recent Labs  04/23/16 0902 04/24/16 0314  WBC 11.9* 15.1*  HGB 12.5 13.0  HCT 37.2 38.6  MCV 91.0 90.2  PLT 190 200   Cardiac Enzymes: Troponin (Point of Care Test)  Recent Labs  04/22/16 2105  TROPIPOC 0.04   Cardiac Panel (last 3 results)  Recent Labs  04/23/16 0902 04/23/16 1347 04/23/16 1943  TROPONINI 0.04* 0.03* 0.05*    Telemetry: sinus  Assessment/Plan:  1. Myoview today shows no ischemia.  Fixed defect seen likely represents attenuation due to obesity. ECHO pending. 2. Unclear as to etiology of syncope but no arryhtmias to explain 3. Weakness is likely multifactorial       W. Doristine Church  MD [facility]  04/24/2016, 2:55 PM

## 2016-04-24 NOTE — Progress Notes (Signed)
  Echocardiogram 2D Echocardiogram has been performed.  Monica Booker 04/24/2016, 4:10 PM

## 2016-04-24 NOTE — Progress Notes (Signed)
Lexiscan myoview completed results to follow.

## 2016-04-25 DIAGNOSIS — R55 Syncope and collapse: Secondary | ICD-10-CM | POA: Diagnosis not present

## 2016-04-25 DIAGNOSIS — E669 Obesity, unspecified: Secondary | ICD-10-CM | POA: Diagnosis not present

## 2016-04-25 DIAGNOSIS — R7989 Other specified abnormal findings of blood chemistry: Secondary | ICD-10-CM | POA: Diagnosis not present

## 2016-04-25 DIAGNOSIS — E1165 Type 2 diabetes mellitus with hyperglycemia: Secondary | ICD-10-CM | POA: Diagnosis not present

## 2016-04-25 LAB — CBC
HEMATOCRIT: 38.3 % (ref 36.0–46.0)
Hemoglobin: 12.9 g/dL (ref 12.0–15.0)
MCH: 30.5 pg (ref 26.0–34.0)
MCHC: 33.7 g/dL (ref 30.0–36.0)
MCV: 90.5 fL (ref 78.0–100.0)
Platelets: 219 10*3/uL (ref 150–400)
RBC: 4.23 MIL/uL (ref 3.87–5.11)
RDW: 12.8 % (ref 11.5–15.5)
WBC: 11.7 10*3/uL — AB (ref 4.0–10.5)

## 2016-04-25 LAB — BASIC METABOLIC PANEL
ANION GAP: 10 (ref 5–15)
BUN: 12 mg/dL (ref 6–20)
CALCIUM: 8.6 mg/dL — AB (ref 8.9–10.3)
CO2: 27 mmol/L (ref 22–32)
Chloride: 98 mmol/L — ABNORMAL LOW (ref 101–111)
Creatinine, Ser: 0.8 mg/dL (ref 0.44–1.00)
GFR calc Af Amer: >60 mL/min (ref >60)
Glucose, Bld: 147 mg/dL — ABNORMAL HIGH (ref 65–99)
POTASSIUM: 3.5 mmol/L (ref 3.5–5.1)
Sodium: 135 mmol/L (ref 135–145)

## 2016-04-25 LAB — URINALYSIS, ROUTINE W REFLEX MICROSCOPIC
BILIRUBIN URINE: NEGATIVE
GLUCOSE, UA: NEGATIVE mg/dL
KETONES UR: NEGATIVE mg/dL
Nitrite: NEGATIVE
PH: 5.5 (ref 5.0–8.0)
Protein, ur: NEGATIVE mg/dL
Specific Gravity, Urine: 1.016 (ref 1.005–1.030)

## 2016-04-25 LAB — GLUCOSE, CAPILLARY
GLUCOSE-CAPILLARY: 146 mg/dL — AB (ref 65–99)
GLUCOSE-CAPILLARY: 116 mg/dL — AB (ref 65–99)
Glucose-Capillary: 139 mg/dL — ABNORMAL HIGH (ref 65–99)

## 2016-04-25 LAB — URINE MICROSCOPIC-ADD ON

## 2016-04-25 LAB — MAGNESIUM: MAGNESIUM: 2 mg/dL (ref 1.7–2.4)

## 2016-04-25 MED ORDER — ASPIRIN 81 MG PO CHEW: 81.0000 mg | CHEWABLE_TABLET | Freq: Every day | ORAL | Status: AC

## 2016-04-25 MED ORDER — CEPHALEXIN 750 MG PO CAPS: 750.0000 mg | ORAL_CAPSULE | Freq: Three times a day (TID) | ORAL | Status: AC

## 2016-04-25 MED ORDER — METOPROLOL TARTRATE 25 MG PO TABS: 12.5000 mg | ORAL_TABLET | Freq: Two times a day (BID) | ORAL | Status: AC

## 2016-04-25 NOTE — Progress Notes (Signed)
Subjective:  Not as weak today, no shortness of breath or chest pain.  Echocardiogram shows no wall motion abnormalities, slight thickness of the aortic valve responsible for the murmur but no significant aortic stenosis.  Has been ambulatory in the hall without difficulty.  Objective:  Vital Signs in the last 24 hours: BP 129/65 mmHg  Pulse 67  Temp(Src) 98.2 F (36.8 C) (Oral)  Resp 15  Ht 5\' 4"  (1.626 m)  Wt 101.107 kg (222 lb 14.4 oz)  BMI 38.24 kg/m2  SpO2 97%  Physical Exam: Obese WF NAD Lungs:  Clear Cardiac:  Regular rhythm, normal S1 and S2, no S3, 1/6 systolic murmur Extremities:  No edema present  Intake/Output from previous day: 07/01 0701 - 07/02 0700 In: 360 [P.O.:360] Out: -   Weight Filed Weights   04/23/16 0354 04/24/16 0332 04/25/16 0352  Weight: 107.684 kg (237 lb 6.4 oz) 107.14 kg (236 lb 3.2 oz) 101.107 kg (222 lb 14.4 oz)    Lab Results: Basic Metabolic Panel:  Recent Labs  04/24/16 0314 04/25/16 0309  NA 134* 135  K 3.4* 3.5  CL 98* 98*  CO2 26 27  GLUCOSE 191* 147*  BUN 11 12  CREATININE 0.68 0.80   CBC:  Recent Labs  04/24/16 0314 04/25/16 0309  WBC 15.1* 11.7*  HGB 13.0 12.9  HCT 38.6 38.3  MCV 90.2 90.5  PLT 200 219   Cardiac Enzymes: Troponin (Point of Care Test)  Recent Labs  04/22/16 2105  TROPIPOC 0.04   Cardiac Panel (last 3 results)  Recent Labs  04/23/16 0902 04/23/16 1347 04/23/16 1943  TROPONINI 0.04* 0.03* 0.05*    Telemetry: sinus  Assessment/Plan:  1.  Weakness has resolved may have been due to UTI 2.  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  Trivial troponin elevation and near syncope probably noncardiac with unremarkable Myoview  Recommendations:  May go home from a cardiovascular viewpoint.  See as needed.     Kerry Hough  MD [facility] Card[facility]  04/25/2016, 8:58 AM

## 2016-04-25 NOTE — Evaluation (Signed)
Physical Therapy Evaluation Patient Details Name: Monica Booker MRN: [id number] DOB: [birth year] Today's Date: 04/25/2016   History of Present Illness  70 y/o obese female with a history of uncontrolled IDDM, HLD, and spinal stenosis with LE neuropathy presents for episode of mid chest pain, left arm numbness and a near syncopal episode.  Clinical Impression  Patient seen for mobility assessment, ambulated in hall and performed functional tasks without difficulty. VSS throughout. No further acute PT needs, will sign off.    Follow Up Recommendations No PT follow up    Equipment Recommendations  None recommended by PT    Recommendations for Other Services       Precautions / Restrictions Restrictions Weight Bearing Restrictions: No      Mobility  Bed Mobility               General bed mobility comments: received at EOB  Transfers Overall transfer level: Independent               General transfer comment: no physical assist, performed from bed and toilet  Ambulation/Gait Ambulation/Gait assistance: Modified independent (Device/Increase time) Ambulation Distance (Feet): 140 Feet Assistive device: None Gait Pattern/deviations: Wide base of support (increased lateral sway) Gait velocity: decreased Gait velocity interpretation: Below normal speed for age/gender General Gait Details: decreased gait speed and increased lateral sway with mobility, 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 No physical assist required             Pertinent Vitals/Pain Pain Assessment: No/denies  pain    Home Living Family/patient expects to be discharged to:: Private residence Living Arrangements: Alone (3 dogs)   Type of Home: Mobile home Home Access: Ramped entrance     Home Layout: One level Home Equipment: [place] - 2 wheels;Cane - single point      Prior Function Level of Independence: Independent               Hand Dominance   Dominant Hand: Right    Extremity/Trunk Assessment   Upper Extremity Assessment: Overall WFL for tasks assessed           Lower Extremity Assessment: Overall WFL for tasks assessed (limited ROM dur to increased body habitus)      Cervical / Trunk Assessment:  (increased body habitus)  Communication   Communication: HOH  Cognition Arousal/Alertness: Awake/alert Behavior During Therapy: WFL for tasks assessed/performed Overall Cognitive Status: Within Functional Limits for tasks assessed                      General Comments General comments (skin integrity, edema, etc.): VSS HR 100s with activity, no physical assist required, no DOE at this time'    Exercises        Assessment/Plan    PT Assessment Patent does not need any further PT services  PT Diagnosis Difficulty walking   PT Problem List    PT Treatment Interventions     PT Goals (Current goals can be found in the Care  Plan section) Acute Rehab PT Goals PT Goal Formulation: All assessment and education complete, DC therapy    Frequency     Barriers to discharge        Co-evaluation               End of Session   Activity Tolerance: Patient tolerated treatment well Patient left: in bed (sitting EOB with MD in room) Nurse Communication: Mobility status    Functional Assessment Tool Used: clinical judgement Functional Limitation: Changing and maintaining body position Changing and Maintaining Body Position Current Status [id number]): At least 1 percent but less than 20 percent impaired, limited or restricted Changing and Maintaining  Body Position Goal Status [id number]): At least 1 percent but less than 20 percent impaired, limited or restricted Changing and Maintaining Body Position Discharge Status [id number]): At least 1 percent but less than 20 percent impaired, limited or restricted    Time: 0819-0840 PT Time Calculation (min) (ACUTE ONLY): 21 min   Charges:   PT Evaluation $PT Eval Low Complexity: 1 Procedure     PT G Codes:   PT G-Codes **NOT FOR INPATIENT CLASS** Functional Assessment Tool Used: clinical judgement Functional Limitation: Changing and maintaining body position Changing and Maintaining Body Position Current Status [id number]): At least 1 percent but less than 20 percent impaired, limited or restricted Changing and Maintaining Body Position Goal Status [id number]): At least 1 percent but less than 20 percent impaired, limited or restricted Changing and Maintaining Body Position Discharge Status [phone]): At least 1 percent but less than 20 percent impaired, limited or restricted    Duncan Dull 04/25/2016, 8:48 AM Alben Deeds, PT DPT  [phone]

## 2016-04-25 NOTE — Discharge Instructions (Signed)

## 2016-04-25 NOTE — Discharge Summary (Signed)
Discharge Summary  Monica Booker [id number] DOB: 1946-02-09  PCP: Luetta Nutting, DO  Admit date: 04/22/2016 Discharge date: 04/25/2016  Time spent: <47mins  Recommendations for Outpatient Follow-up:  F/u with PMD within a week  for hospital discharge follow up, repeat cbc/bmp at follow up, advised patient to check her blood pressure at home and bring in blood pressure record to hospital discharge follow up, pmd to follow up for thyroid nodular lesion.  F/u with endocrinology for blood sugar control  Discharge Diagnoses:  Active Hospital Problems   Diagnosis Date Noted  . Syncope and collapse 04/23/2016  . Troponin level elevated 04/23/2016  . Obesity (BMI 30-39.9) 04/23/2016  . Dyslipidemia 04/23/2016  . Palpitations 04/23/2016  . Insulin dependent type 2 diabetes mellitus, uncontrolled ([place]) 04/22/2016    Resolved Hospital Problems   Diagnosis Date Noted Date Resolved  No resolved problems to display.    Discharge Condition: stable  Diet recommendation: heart healthy/carb modified  Filed Weights   04/23/16 0354 04/24/16 0332 04/25/16 0352  Weight: 107.684 kg (237 lb 6.4 oz) 107.14 kg (236 lb 3.2 oz) 101.107 kg (222 lb 14.4 oz)    History of present illness:  HPI: Monica Booker is a 70 y.o. female with medical history significant of DM, obesity, HLD. Patient presents to the ED with c/o chest pain. Symptoms onset 2 hours ago, have persisted until finally mostly resolving with rest and NTG. Pain is Dull in quality, located in substernal area with radiation to R arm. No N/V, dizziness, palpitations. Does have SOB.  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  Hospital Course:  Principal Problem:   Syncope and collapse Active Problems:   Insulin dependent type 2 diabetes mellitus, uncontrolled (H[facility])   Troponin level elevated   Obesity (BMI 30-39.9)   Dyslipidemia    Palpitations   Chest pain , pressure like with associated sob, risk factor including uncontrolled dm2,hdl on lipitor, obesity, mild troponin elevation which peaked at 0.12,  trended down. Cardiology consulted, stress test unremarkable, echo LVEF wnl, no wall motion abnormality. Does have aortic sclerosis responsible for systolic murmur heard on exam. Cardiology cleared patient to be discharged from cardiac stand point.  Elevated ddimer: ekg with RBBB, CTA negative for PE  Presyncope: likely from dehydration/uti. Resolved.  Hypotension (81/35)/leukocytosis/mild elevated cr from dehydration and possible uti,  urine culture not done, patient with significant improvement with empirically abx rocephin. Will discharge  On oral keflex for three days to finish total of 5 day abx treatment.  HTN; her blood pressure improved after hydration and treating uti, now hypertension, she reported she has not been on blood pressure meds before, she is started on low dose betablocker in the setting of troponin elevation, she is discharged on it, she is to check blood pressure at home and follow up with pmd to continue adjust blood pressure meds.  Insulin dependent dm2, a1c here pending, on insulin, she reports she is closely working with her endocrinologist on blood sugar control, her a1c used to be 14, most recently was 9. Continue home insulin regimen, continue close follow up with endocrinology  Obesity with h/o gastric bypass surgery  Stable enlargement of the left lobe of the thyroid with dominant nodular lesion arising from the left lobe. tsh wnl, pmd to follow up for thyroid nodular lesion.    Code Status: full  Family Communication: patient   Disposition Plan: home on 7/2  Consultants:  cardiology  Procedures:  Stress test  Antibiotics:  rocephin   Discharge Exam: BP 129/65 mmHg  Pulse 67  Temp(Src) 98.2 F (36.8 C) (Oral)  Resp 15  Ht 5\' 4"  (1.626 m)  Wt 101.107 kg (222 lb  14.4 oz)  BMI 38.24 kg/m2  SpO2 97%   General: NAD, obese  Cardiovascular: RRR, does has chest wall pain on palpation  Respiratory: CTABL  Abdomen: Soft/ND/NT, positive BS  Musculoskeletal: No Edema  Neuro: aaox3   Discharge Instructions You were cared for by a hospitalist during your hospital stay. If you have any questions about your discharge medications or the care you received while you were in the hospital after you are discharged, you can call the unit and asked to speak with the hospitalist on call if the hospitalist that took care of you is not available. Once you are discharged, your primary care physician will handle any further medical issues. Please note that NO REFILLS for any discharge medications will be authorized once you are discharged, as it is imperative that you return to your primary care physician (or establish a relationship with a primary care physician if you do not have one) for your aftercare needs so that they can reassess your need for medications and monitor your lab values.      Discharge Instructions    Diet - low sodium heart healthy    Complete by:  As directed   Carb modified     Increase activity slowly    Complete by:  As directed             Medication List    TAKE these medications        ALEVE 220 MG tablet  Generic drug:  naproxen sodium  Take 220 mg by mouth 2 (two) times daily as needed (for pain or headaches).     aspirin 81 MG chewable tablet  Chew 1 tablet (81 mg total) by mouth daily.     atorvastatin 40 MG tablet  Commonly known as:  LIPITOR  Take 40 mg by mouth every evening.     baclofen 10 MG tablet  Commonly known as:  LIORESAL  Take 10 mg by mouth 3 (three) times daily as needed for muscle spasms.     cephALEXin 750 MG capsule  Commonly known as:  KEFLEX  Take 1 capsule (750 mg total) by mouth 3 (three) times daily.     DULoxetine 60 MG capsule  Commonly known as:  CYMBALTA  Take 60 mg by mouth 2 (two)  times daily.     meclizine 25 MG tablet  Commonly known as:  ANTIVERT  Take 25 mg by mouth 3 (three) times daily as needed for dizziness.     metoprolol tartrate 25 MG tablet  Commonly known as:  LOPRESSOR  Take 0.5 tablets (12.5 mg total) by mouth 2 (two) times daily.     NOVOLIN 70/30 RELION (70-30) 100 UNIT/ML injection  Generic drug:  insulin NPH-regular Human  Inject 30 Units into the skin 2 (two) times daily.     ONETOUCH VERIO test strip  Generic drug:  glucose blood  Test twice daily       Allergies  Allergen Reactions  . Tramadol Nausea And Vomiting and Other (See Comments)    Also makes the patient feel dizzy and like she is "out of her mind"   Follow-up Information    Follow up with Luetta Nutting, DO In 1 week.   Specialty:  Family Medicine   Why:  hospital discharge follow up, repeat cbc/bmp at follow up. please check your blood pressure at home and bring in your blood pressure record to your pmd follow up for blood pressure medication adjustment.   Contact information:   [address] [place] [address] [phone]       Follow up with [facility], MD In 2 weeks.   Why:  hospital discharge follow up ,  for blood sugar control, endocrinology to follow up on enlargement of the left lobe of the thyroid .   Contact information:   [id number]  [place] [address] [phone]        The results of significant diagnostics from this hospitalization (including imaging, microbiology, ancillary and laboratory) are listed below for reference.    Significant Diagnostic Studies: Dg Chest 2 View  04/22/2016  CLINICAL DATA:  Chest pain. History of colorectal carcinoma. Near syncope. EXAM: CHEST  2 VIEW COMPARISON:  Chest radiograph December 24, 2007; PET-CT June 26, 2013 FINDINGS: There is slight scarring in the left base. There is no edema or consolidation. Heart size and pulmonary vascularity are normal. No adenopathy. There is  atherosclerotic calcification in the aortic arch region. There is degenerative change in the thoracic spine and both shoulders. IMPRESSION: Slight scarring left base. No edema or consolidation. Aortic atherosclerosis. Electronically Signed   By: Lowella Grip III M.D.   On: 04/22/2016 20:45   Ct Angio Chest Pe W Or Wo Contrast  04/23/2016  CLINICAL DATA:  Shortness of breath. Recent syncope. History of colorectal carcinoma EXAM: CT ANGIOGRAPHY CHEST WITH CONTRAST TECHNIQUE: Multidetector CT imaging of the chest was performed using the standard protocol during bolus administration of intravenous contrast. Multiplanar CT image reconstructions and MIPs were obtained to evaluate the vascular anatomy. CONTRAST:  65 mL Isovue 370 nonionic COMPARISON:  PET-CT June 26, 2013; chest radiograph April 22, 2016 FINDINGS: Cardiovascular: There is no demonstrable pulmonary embolus. There is no thoracic aortic aneurysm or dissection. Visualized great vessels appear unremarkable. There are scattered foci of atherosclerotic calcification in the aorta. There are scattered foci of coronary artery calcification. There is pericardium is not appreciably thickened. Mediastinum/Nodes: There is enlargement of the left lobe of the thyroid with a dominant nodular lesion arising from the left lobe of the thyroid measuring 3.0 x 2.0 cm, unchanged from 2014 study. There is no appreciable thoracic adenopathy. There is thickening of the wall of the mid to distal esophagus, a finding also present on the prior study from 2014. Lungs/Pleura: There is patchy atelectatic change in the lung bases bilaterally. There is mild scarring in the anterior lung base regions bilaterally, also present previously. There is no frank edema or consolidation. Upper Abdomen: In the visualized upper abdomen, there is evidence prior gastric bypass surgery. Spleen is incompletely visualized but appears prominent. Visualized upper abdominal structures otherwise  appear unremarkable. Musculoskeletal: There is degenerative change in the thoracic spine. There are no blastic or lytic bone lesions. Review of the MIP images confirms the above findings. IMPRESSION: No demonstrable pulmonary embolus. Aortic atherosclerosis. Stable enlargement of the left lobe of the thyroid with dominant nodular lesion arising from the left lobe. No adenopathy. Areas of parenchymal lung scarring and atelectasis, without edema or consolidation. Status post gastric bypass surgery. Spleen incompletely visualized but appears rather prominent. Electronically Signed   By: Lowella Grip III M.D.   On: 04/23/2016 12:07   Nm Myocar Multi W/spect W/wall Motion / Ef  04/24/2016  CLINICAL DATA:  Chest pain. Shortness of breath. Syncopal episode. Diabetes and hyperlipidemia. EXAM: MYOCARDIAL IMAGING WITH SPECT (REST AND PHARMACOLOGIC-STRESS) GATED LEFT VENTRICULAR WALL MOTION STUDY LEFT VENTRICULAR EJECTION FRACTION TECHNIQUE: Standard myocardial SPECT imaging was performed after resting intravenous injection of 10 mCi Tc-37m tetrofosmin. Subsequently, intravenous infusion of Lexiscan was performed under the supervision of the Cardiology staff. At peak effect of the drug, 30 mCi Tc-43m tetrofosmin was injected intravenously and standard myocardial SPECT imaging was performed. Quantitative gated imaging was also performed to evaluate left ventricular wall motion, and estimate left ventricular ejection fraction. COMPARISON:  None. FINDINGS: Perfusion: A large area decrease myocardial activity is seen in the septal and inferior walls in the left ventricle on stress imaging, which shows no evidence of reversibility on resting images. Although this could be due to diaphragmatic attenuation, myocardial infarction cannot be excluded. Wall Motion: Inferior and septal wall hypokinesis noted. No left ventricular dilation. Left Ventricular Ejection Fraction: 62 % End diastolic volume 77 ml End systolic volume 29 ml  IMPRESSION: 1. No reversible ischemia. Large fixed defect involving the septal and inferior walls, which favors myocardial infarct over diaphragmatic attenuation given corresponding left ventricular wall motion abnormality. 2. Inferior and septal wall hypokinesis. 3. Left ventricular ejection fraction 62% 4. Non invasive risk stratification*: Low *2012 Appropriate Use Criteria for Coronary Revascularization Focused Update: J Am Coll Cardiol. [id number]. http://content.[email or web address].aspx?articleid=[email or web address] Electronically Signed   By: Earle Gell M.D.   On: 04/24/2016 12:38    Microbiology: No results found for this or any previous visit (from the past 240 hour(s)).   Labs: Basic Metabolic Panel:  Recent Labs Lab 04/22/16 2042 04/23/16 0902 04/24/16 0314 04/25/16 0309  NA 132* 134* 134* 135  K 3.8 3.5 3.4* 3.5  CL 99* 99* 98* 98*  CO2 23 27 26 27   GLUCOSE 365* 289* 191* 147*  BUN 13 14 11 12   CREATININE 1.19* 0.91 0.68 0.80  CALCIUM 8.3* 8.5* 8.6* 8.6*  MG  --   --  1.6* 2.0   Liver Function Tests:  Recent Labs Lab 04/23/16 0902 04/24/16 0314  AST 17 19  ALT 16 15  ALKPHOS 73 88  BILITOT 1.4* 1.1  PROT 6.7 6.6  ALBUMIN 2.7* 2.6*   No results for input(s): LIPASE, AMYLASE in the last 168 hours. No results for input(s): AMMONIA in the last 168 hours. CBC:  Recent Labs Lab 04/22/16 2042 04/23/16 0902 04/24/16 0314 04/25/16 0309  WBC 14.1* 11.9* 15.1* 11.7*  HGB 12.1 12.5 13.0 12.9  HCT 35.8* 37.2 38.6 38.3  MCV 91.3 91.0 90.2 90.5  PLT 210 190 200 219   Cardiac Enzymes:  Recent Labs Lab 04/23/16 0215 04/23/16 0355 04/23/16 0902 04/23/16 1347 04/23/16 1943  TROPONINI 0.04* 0.12* 0.04* 0.03* 0.05*   BNP: BNP (last 3 results) No results for input(s): BNP in the last 8760 hours.  ProBNP (last 3 results) No results for input(s): PROBNP in the last 8760 hours.  CBG:  Recent Labs Lab 04/24/16 1942 04/24/16 2338 04/25/16 0348  04/25/16 0752 04/25/16 1120  GLUCAP 264* 177* 146* 116* 139*       Signed:  Noura Purpura MD, PhD  [facility] 04/25/2016, 2:15 PM

## 2016-04-26 LAB — HEMOGLOBIN A1C
Hgb A1c MFr Bld: 10.9 % — ABNORMAL HIGH (ref 4.8–5.6)
Mean Plasma Glucose: 266 mg/dL

## 2016-04-26 LAB — URINE CULTURE
